# Patient Record
Sex: Female | Born: 1970 | Race: White | Hispanic: No | Marital: Married | Smoking: Never smoker
Health system: Southern US, Community
[De-identification: ages and names within clinical notes are randomized; demographics above are authoritative.]

## PROBLEM LIST (undated history)

## (undated) DIAGNOSIS — F32A Depression, unspecified: Secondary | ICD-10-CM

## (undated) DIAGNOSIS — N920 Excessive and frequent menstruation with regular cycle: Secondary | ICD-10-CM

## (undated) DIAGNOSIS — K219 Gastro-esophageal reflux disease without esophagitis: Secondary | ICD-10-CM

## (undated) DIAGNOSIS — Z973 Presence of spectacles and contact lenses: Secondary | ICD-10-CM

## (undated) DIAGNOSIS — L738 Other specified follicular disorders: Secondary | ICD-10-CM

## (undated) DIAGNOSIS — E039 Hypothyroidism, unspecified: Secondary | ICD-10-CM

## (undated) DIAGNOSIS — M1990 Unspecified osteoarthritis, unspecified site: Secondary | ICD-10-CM

## (undated) DIAGNOSIS — F329 Major depressive disorder, single episode, unspecified: Secondary | ICD-10-CM

## (undated) DIAGNOSIS — F419 Anxiety disorder, unspecified: Secondary | ICD-10-CM

## (undated) HISTORY — PX: TUBAL LIGATION: SHX77

## (undated) HISTORY — DX: Gastro-esophageal reflux disease without esophagitis: K21.9

## (undated) HISTORY — DX: Major depressive disorder, single episode, unspecified: F32.9

## (undated) HISTORY — DX: Other specified follicular disorders: L73.8

## (undated) HISTORY — DX: Hypothyroidism, unspecified: E03.9

## (undated) HISTORY — DX: Depression, unspecified: F32.A

## (undated) HISTORY — DX: Unspecified osteoarthritis, unspecified site: M19.90

## (undated) HISTORY — DX: Excessive and frequent menstruation with regular cycle: N92.0

---

## 2002-05-29 HISTORY — PX: TUBAL LIGATION: SHX77

## 2002-08-05 ENCOUNTER — Other Ambulatory Visit: Admission: RE | Admit: 2002-08-05 | Discharge: 2002-08-05 | Payer: Self-pay | Admitting: Gynecology

## 2003-01-06 ENCOUNTER — Encounter: Admission: RE | Admit: 2003-01-06 | Discharge: 2003-01-06 | Payer: Self-pay | Admitting: Gynecology

## 2003-01-09 ENCOUNTER — Encounter: Admission: RE | Admit: 2003-01-09 | Discharge: 2003-01-09 | Payer: Self-pay | Admitting: Gynecology

## 2003-01-13 ENCOUNTER — Encounter: Admission: RE | Admit: 2003-01-13 | Discharge: 2003-01-13 | Payer: Self-pay | Admitting: Obstetrics and Gynecology

## 2003-01-16 ENCOUNTER — Encounter: Admission: RE | Admit: 2003-01-16 | Discharge: 2003-01-16 | Payer: Self-pay | Admitting: Gynecology

## 2003-01-20 ENCOUNTER — Encounter: Admission: RE | Admit: 2003-01-20 | Discharge: 2003-01-20 | Payer: Self-pay | Admitting: Gynecology

## 2003-01-23 ENCOUNTER — Encounter: Admission: RE | Admit: 2003-01-23 | Discharge: 2003-01-23 | Payer: Self-pay | Admitting: Gynecology

## 2003-01-27 ENCOUNTER — Encounter: Admission: RE | Admit: 2003-01-27 | Discharge: 2003-01-27 | Payer: Self-pay | Admitting: Gynecology

## 2003-01-30 ENCOUNTER — Encounter: Admission: RE | Admit: 2003-01-30 | Discharge: 2003-01-30 | Payer: Self-pay | Admitting: Gynecology

## 2003-02-03 ENCOUNTER — Encounter: Admission: RE | Admit: 2003-02-03 | Discharge: 2003-02-03 | Payer: Self-pay | Admitting: Gynecology

## 2003-02-06 ENCOUNTER — Inpatient Hospital Stay (HOSPITAL_COMMUNITY): Admission: AD | Admit: 2003-02-06 | Discharge: 2003-02-09 | Payer: Self-pay | Admitting: Gynecology

## 2003-02-06 ENCOUNTER — Encounter (INDEPENDENT_AMBULATORY_CARE_PROVIDER_SITE_OTHER): Payer: Self-pay

## 2003-02-06 ENCOUNTER — Encounter: Admission: RE | Admit: 2003-02-06 | Discharge: 2003-02-06 | Payer: Self-pay | Admitting: Gynecology

## 2003-03-24 ENCOUNTER — Other Ambulatory Visit: Admission: RE | Admit: 2003-03-24 | Discharge: 2003-03-24 | Payer: Self-pay | Admitting: Gynecology

## 2004-08-01 ENCOUNTER — Other Ambulatory Visit: Admission: RE | Admit: 2004-08-01 | Discharge: 2004-08-01 | Payer: Self-pay | Admitting: Gynecology

## 2005-10-09 ENCOUNTER — Other Ambulatory Visit: Admission: RE | Admit: 2005-10-09 | Discharge: 2005-10-09 | Payer: Self-pay | Admitting: Gynecology

## 2006-10-25 ENCOUNTER — Other Ambulatory Visit: Admission: RE | Admit: 2006-10-25 | Discharge: 2006-10-25 | Payer: Self-pay | Admitting: Gynecology

## 2008-04-13 ENCOUNTER — Ambulatory Visit: Payer: Self-pay | Admitting: Gynecology

## 2008-08-20 ENCOUNTER — Encounter: Payer: Self-pay | Admitting: Women's Health

## 2008-08-20 ENCOUNTER — Ambulatory Visit: Payer: Self-pay | Admitting: Obstetrics and Gynecology

## 2008-08-20 ENCOUNTER — Other Ambulatory Visit: Admission: RE | Admit: 2008-08-20 | Discharge: 2008-08-20 | Payer: Self-pay | Admitting: Gynecology

## 2009-02-19 ENCOUNTER — Ambulatory Visit: Payer: Self-pay | Admitting: Gynecology

## 2009-12-31 ENCOUNTER — Ambulatory Visit: Payer: Self-pay | Admitting: Gynecology

## 2009-12-31 ENCOUNTER — Other Ambulatory Visit: Admission: RE | Admit: 2009-12-31 | Discharge: 2009-12-31 | Payer: Self-pay | Admitting: Gynecology

## 2010-08-16 ENCOUNTER — Ambulatory Visit (INDEPENDENT_AMBULATORY_CARE_PROVIDER_SITE_OTHER): Payer: PRIVATE HEALTH INSURANCE | Admitting: Gynecology

## 2010-08-16 DIAGNOSIS — B373 Candidiasis of vulva and vagina: Secondary | ICD-10-CM

## 2010-08-16 DIAGNOSIS — N898 Other specified noninflammatory disorders of vagina: Secondary | ICD-10-CM

## 2010-10-14 NOTE — Discharge Summary (Signed)
   NAME:  Dominique Mendoza, Dominique Mendoza                         ACCOUNT NO.:  [id number]   MEDICAL RECORD NO.:  [id number]                   PATIENT TYPE:  INP   LOCATION:  9106                                 FACILITY:  [facility]   PHYSICIAN:  Ivor Costa. Farrel Gobble, M.D.              DATE OF BIRTH:  04-15-1971   DATE OF ADMISSION:  02/06/2003  DATE OF DISCHARGE:  02/09/2003                                 DISCHARGE SUMMARY   DISCHARGE DIAGNOSES:  1. Intrauterine pregnancy 36 weeks and 4 days, delivered spontaneous, twins.  2. Breech presentation.  3. Labor.  4. Undesired fertility.  5. Status post primary cesarean section, low flap transverse and modified     Pomeroy tubal ligation by Ivor Costa. Farrel Gobble, M.D. on February 06, 2003.  6. Rubella nonimmune.   HISTORY:  A 40 year old female gravida 2, para 1 with an EDC of March 03, 2003.  Prenatal course had been complicated by rubella nonimmune, also was  hypothyroid, also spontaneous twins with breech presentation, also desired  attempt at permanent sterilization.  Was found at nonstress test on  February 06, 2003 to be having contractions not painful to patient.  However, vaginal examination revealed patient to be 3 cm, 70%, and -2.  Therefore, patient was admitted for breech/breech presentation of twins with  an early labor.   HOSPITAL COURSE:  On February 06, 2003 patient underwent a primary cesarean  section low flap transverse, modified Pomeroy tubal ligation by Ivor Costa.  Lathrop, M.D. without complications and underwent delivery of a female, Apgars  of 8 and 9, weight of 4 pounds 13 ounces and a second female, Apgars of 9 and  9, weight of 4 pounds 9 ounces.  Postoperatively patient remained afebrile,  stable condition.  She was discharged to home on February 09, 2003 in  satisfactory condition having been given [facility] Gynecology postpartum  instruction/postpartum booklet.   ACCESSORY CLINICAL FINDINGS:  Laboratories:  The patient is O+.   Rubella  nonimmune.  On February 07, 2003 hemoglobin 8.   DISPOSITION:  The patient is discharged to home.  Given a prescription for  Tylox p.r.n. pain.  She was to continue her Synthroid.  She was given  rubella vaccine prior to discharge.  Follow up in the office in six weeks.     Susa Loffler, P.A.                    Ivor Costa. Farrel Gobble, M.D.    TSG/MEDQ  D:  02/27/2003  T:  02/27/2003  Job:  [id number]

## 2010-10-14 NOTE — Op Note (Signed)
NAME:  Dominique Mendoza, Dominique Mendoza                         ACCOUNT NO.:  [id number]   MEDICAL RECORD NO.:  [id number]                   PATIENT TYPE:  INP   LOCATION:  9106                                 FACILITY:  [facility]   PHYSICIAN:  Ivor Costa. Farrel Gobble, M.D.              DATE OF BIRTH:  1970-10-07   DATE OF PROCEDURE:  02/06/2003  DATE OF DISCHARGE:                                 OPERATIVE REPORT   PREOPERATIVE DIAGNOSIS:  1. Spontaneous twins at 36 weeks.  2. Early labor.  3. Undesired fertility.   POSTOPERATIVE DIAGNOSIS:  1. Spontaneous twins at 36 weeks.  2. Early  labor.  3. Undesired fertility.   OPERATION/PROCEDURE:  1. Primary cesarean section. LFT  2. Modified Pomeroy tubal ligation.   SURGEON:  Ivor Costa. Farrel Gobble, M.D.   ANESTHESIA:  Spinal   ESTIMATED BLOOD LOSS:    URINE OUTPUT:    INTRAVENOUS FLUIDS:  4 liters.   FINDINGS:  Infant A was female, frank breech, clear amniotic fluid, Apgars 8  and 9, birth weight 4.13.  Baby B was delivered complex compound with the  hand vertex, clear amniotic fluid, Apgars 8 and 9, birth weight 4.9, also  female.  Normal uterus, tubes, and ovaries.   COMPLICATIONS:  None.   PATHOLOGY:  Placenta.   DESCRIPTION OF PROCEDURE:  The patient was taken to the operating room.  Spinal anesthesia was induced, placed in a supine position, left lateral  displacement,  prepped and draped in the usual sterile fashion.  After  adequate anesthesia was ensured, a Pfannenstiel skin incision was made with  a scalpel and carried through the underlying layer of fascia with  electrocautery.  The fascia was scored at the midline and extended laterally  with electrocautery.  Inferior aspect of the fascia was grasped with  Kochers. Underlying rectus muscles were dissected off with sharp and blunt  dissection. The superior fascia was  dissected off the underlying rectus  muscles.  The rectus muscles were separated in the midline.  The peritoneum  was identified and entered bluntly.  The peritoneal incision was then  extended superiorly and inferiorly with good visualization of the underlying  bowel and bladder.  The orientation of the uterus is confirmed.  The bladder  blade was inserted.  The vesicouterine peritoneum was identified and entered  sharply with Metzenbaums, the incision was extended laterally.  The bladder  flap was created digitally.  The bladder blade was then reinserted, the  lower uterine segment was incised in the transverse fashion with a scalpel.  Clear amniotic fluid was noted.  The infant A was delivered from the frank  breech presentation.  The infant was secured prior to amniotomy, then  delivered with the usual maneuvers.  Cord was cut and clamped and handed off  to the awaiting pediatrician.  Infant B kept presenting with a hand despite  multiple attempts to replace the hand  back in the cavity or to grasp the  fetal foot.  We elected to just do an amniotomy and then deliver infant B  from the vertex presentation compounded with hand.  Cord was cut and clamped  and was again handed off to the awaiting pediatrician.  The cord bloods were  obtained.  The placenta B was clamped with an umbilical clamp, , uterus was  massaged, the placenta was allowed to separate naturally.  The uterus was  then cleared of all clots and debris.  Uterine incision was repaired with a  running locking layer of 0 chromic.  Hemostasis was assured.  The uterus was  then dextrorotated.  The mid portion of the tube was elevated with a  Babcock, two free ties of 2-0 plain were placed, and the mid portion was  excised and the salpinx was also sharply excised, and hemostasis was  assured.  The uterus was then levorotated in a similar fashion the mid  portion of the tube was excised sharply.  Again, hemostasis was assured  before returning it back to the abdomen.  The pelvis was then irrigated with  copious amounts of warm saline.   Reinspection of the incision ensures  hemostasis.  Also, hemostasis was assured under the bladder flap,  peritoneum, and muscles.  The fascia was then closed with 0 Vicryl in a  running fashion.  The subcu was irrigated.  The skin was closed with  staples.  The patient tolerated the procedure well.  Sponge and needle  counts were correct x 2, and she was transferred to the PACU in stable  condition.  She received Ancef intraoperatively at cord clamp.                                               Ivor Costa. Farrel Gobble, M.D.    THL/MEDQ  D:  02/06/2003  T:  02/08/2003  Job:  [id number]

## 2011-02-10 ENCOUNTER — Encounter: Payer: Self-pay | Admitting: Women's Health

## 2011-02-10 ENCOUNTER — Other Ambulatory Visit (HOSPITAL_COMMUNITY)
Admission: RE | Admit: 2011-02-10 | Discharge: 2011-02-10 | Disposition: A | Discharge status: Home / Self Care | Payer: PRIVATE HEALTH INSURANCE | Source: Ambulatory Visit | Attending: Women's Health | Admitting: Women's Health

## 2011-02-10 ENCOUNTER — Ambulatory Visit (INDEPENDENT_AMBULATORY_CARE_PROVIDER_SITE_OTHER): Payer: PRIVATE HEALTH INSURANCE | Admitting: Women's Health

## 2011-02-10 VITALS — BP 124/80 | Ht 63.0 in | Wt 216.0 lb

## 2011-02-10 DIAGNOSIS — Z01419 Encounter for gynecological examination (general) (routine) without abnormal findings: Secondary | ICD-10-CM | POA: Insufficient documentation

## 2011-02-10 DIAGNOSIS — N920 Excessive and frequent menstruation with regular cycle: Secondary | ICD-10-CM

## 2011-02-10 DIAGNOSIS — E039 Hypothyroidism, unspecified: Secondary | ICD-10-CM | POA: Insufficient documentation

## 2011-02-10 MED ORDER — TRANEXAMIC ACID 650 MG PO TABS: 1300.0000 mg | ORAL_TABLET | Freq: Three times a day (TID) | ORAL | Status: DC

## 2011-02-10 NOTE — Progress Notes (Signed)
Dominique Mendoza September 17, 1970 [id number]    History:    The patient presents for annual exam.  Working as a Scientist, physiological in the mental health facility.   Past medical history, past surgical history, family history and social history were all reviewed and documented in the EPIC chart.   ROS:  A  ROS was performed and pertinent positives and negatives are included in the history.  Exam:  Filed Vitals:   02/10/11 0800  BP: 124/80    General appearance:  Normal Head/Neck:  Normal, without cervical or supraclavicular adenopathy. Thyroid:  Symmetrical, normal in size, without palpable masses or nodularity. Respiratory  Effort:  Normal  Auscultation:  Clear without wheezing or rhonchi Cardiovascular  Auscultation:  Regular rate, without rubs, murmurs or gallops  Edema/varicosities:  Not grossly evident Abdominal  Soft,nontender, without masses, guarding or rebound.  Liver/spleen:  No organomegaly noted  Hernia:  None appreciated  Skin  Inspection:  Grossly normal  Palpation:  Grossly normal Neurologic/psychiatric  Orientation:  Normal with appropriate conversation.  Mood/affect:  Normal  Genitourinary    Breasts: Examined lying and sitting.     Right: Without masses, retractions, discharge or axillary adenopathy.     Left: Without masses, retractions, discharge or axillary adenopathy.   Inguinal/mons:  Normal without inguinal adenopathy  External genitalia:  Normal  BUS/Urethra/Skene's glands:  Normal  Bladder:  Normal  Vagina:  Normal  Cervix:  Normal  Uterus:  retroverted, normal in size, shape and contour.  Midline and mobile  Adnexa/parametria:     Rt: Without masses or tenderness.   Lt: Without masses or tenderness.  Anus and perineum: Normal  Digital rectal exam: Normal sphincter tone without palpated masses or tenderness  Assessment/Plan:  40 y.o. MWFG2 P3 for annual exam.  Monthly 6 day cycle menorrhagia/BTL. Her insurance does not cover an ablation, and states  cannot afford. Other options of lysteda was reviewed. Would like to try did review slight risk for blood clots. She is a nonsmoker prescription for lysteda 652 tablets 3 times a day with cycle for no longer than 5 days prescription proper use was given will call if no relief. SBEs, annual mammogram, breast center number was given will schedule. Reviewed importance of weight loss for health, increasing exercise decreasing calories. Pap only today. Hypothyroid and depression labs meds of Synthroid and Effexor/ primary care.   Harrington Challenger [facility], 8:38 AM 02/10/2011

## 2011-02-13 ENCOUNTER — Other Ambulatory Visit: Payer: Self-pay | Admitting: Gynecology

## 2011-02-13 DIAGNOSIS — Z1231 Encounter for screening mammogram for malignant neoplasm of breast: Secondary | ICD-10-CM

## 2011-02-22 ENCOUNTER — Ambulatory Visit (HOSPITAL_COMMUNITY)
Admission: RE | Admit: 2011-02-22 | Discharge: 2011-02-22 | Disposition: A | Discharge status: Home / Self Care | Payer: PRIVATE HEALTH INSURANCE | Source: Ambulatory Visit | Attending: Gynecology | Admitting: Gynecology

## 2011-02-22 DIAGNOSIS — Z1231 Encounter for screening mammogram for malignant neoplasm of breast: Secondary | ICD-10-CM

## 2011-03-13 ENCOUNTER — Other Ambulatory Visit: Payer: Self-pay | Admitting: Gynecology

## 2011-03-14 ENCOUNTER — Other Ambulatory Visit: Payer: Self-pay | Admitting: *Deleted

## 2011-03-14 DIAGNOSIS — N6489 Other specified disorders of breast: Secondary | ICD-10-CM

## 2011-03-23 ENCOUNTER — Other Ambulatory Visit: Payer: Self-pay | Admitting: Gynecology

## 2011-03-23 ENCOUNTER — Ambulatory Visit
Admission: RE | Admit: 2011-03-23 | Discharge: 2011-03-23 | Disposition: A | Discharge status: Home / Self Care | Payer: PRIVATE HEALTH INSURANCE | Source: Ambulatory Visit | Attending: Gynecology | Admitting: Gynecology

## 2011-03-23 DIAGNOSIS — N6489 Other specified disorders of breast: Secondary | ICD-10-CM

## 2011-03-23 DIAGNOSIS — N63 Unspecified lump in unspecified breast: Secondary | ICD-10-CM

## 2011-03-28 ENCOUNTER — Ambulatory Visit
Admission: RE | Admit: 2011-03-28 | Discharge: 2011-03-28 | Disposition: A | Discharge status: Home / Self Care | Payer: PRIVATE HEALTH INSURANCE | Source: Ambulatory Visit | Attending: Gynecology | Admitting: Gynecology

## 2011-03-28 ENCOUNTER — Other Ambulatory Visit: Payer: Self-pay | Admitting: Diagnostic Radiology

## 2011-03-28 DIAGNOSIS — N63 Unspecified lump in unspecified breast: Secondary | ICD-10-CM

## 2011-03-28 HISTORY — PX: BREAST BIOPSY: SHX20

## 2011-08-11 ENCOUNTER — Telehealth: Payer: Self-pay | Admitting: *Deleted

## 2011-08-11 NOTE — Telephone Encounter (Signed)
Pt called c/o yeast infection due to antibody and requested some diflucan from TF, spoke with pt an told her that TF and nancy are out of the office, pt said she will call back on Monday.

## 2011-08-18 ENCOUNTER — Other Ambulatory Visit: Payer: Self-pay

## 2011-08-18 MED ORDER — FLUCONAZOLE 150 MG PO TABS: 150.0000 mg | ORAL_TABLET | Freq: Once | ORAL | Status: AC

## 2011-08-18 NOTE — Telephone Encounter (Signed)
Patient called to say she had been on abx and c/o itching and burning and requested rx for Diflucan.  Send in one pill and told patient if no better to make appt next week.

## 2011-08-21 NOTE — Telephone Encounter (Signed)
Ok - you already sent in diflucan.  No other action at this time - Correct?

## 2011-08-22 ENCOUNTER — Other Ambulatory Visit: Payer: Self-pay | Admitting: *Deleted

## 2011-08-22 DIAGNOSIS — Z09 Encounter for follow-up examination after completed treatment for conditions other than malignant neoplasm: Secondary | ICD-10-CM

## 2011-08-22 NOTE — Telephone Encounter (Signed)
That is correct 

## 2011-09-15 ENCOUNTER — Ambulatory Visit
Admission: RE | Admit: 2011-09-15 | Discharge: 2011-09-15 | Disposition: A | Discharge status: Home / Self Care | Payer: PRIVATE HEALTH INSURANCE | Source: Ambulatory Visit | Attending: Gynecology | Admitting: Gynecology

## 2011-09-15 DIAGNOSIS — Z09 Encounter for follow-up examination after completed treatment for conditions other than malignant neoplasm: Secondary | ICD-10-CM

## 2012-02-14 ENCOUNTER — Encounter: Payer: Self-pay | Admitting: Gynecology

## 2012-02-14 ENCOUNTER — Ambulatory Visit (INDEPENDENT_AMBULATORY_CARE_PROVIDER_SITE_OTHER): Payer: PRIVATE HEALTH INSURANCE | Admitting: Gynecology

## 2012-02-14 VITALS — BP 124/82 | Ht 63.75 in | Wt 227.0 lb

## 2012-02-14 DIAGNOSIS — N760 Acute vaginitis: Secondary | ICD-10-CM

## 2012-02-14 DIAGNOSIS — A499 Bacterial infection, unspecified: Secondary | ICD-10-CM

## 2012-02-14 DIAGNOSIS — N898 Other specified noninflammatory disorders of vagina: Secondary | ICD-10-CM

## 2012-02-14 DIAGNOSIS — B9689 Other specified bacterial agents as the cause of diseases classified elsewhere: Secondary | ICD-10-CM

## 2012-02-14 DIAGNOSIS — Z01419 Encounter for gynecological examination (general) (routine) without abnormal findings: Secondary | ICD-10-CM

## 2012-02-14 LAB — WET PREP FOR TRICH, YEAST, CLUE

## 2012-02-14 MED ORDER — METRONIDAZOLE 500 MG PO TABS: 500.0000 mg | ORAL_TABLET | Freq: Two times a day (BID) | ORAL | Status: DC

## 2012-02-14 NOTE — Progress Notes (Signed)
Dominique Mendoza 26-Jun-1970 [id number]   History:    41 y.o.  for annual gyn exam with complaint of brownish yellow-like discharge for the past few days. Patient's had a previous tubal sterilization procedure. Patient with a monogamous relationship. Review of her record indicated that she has a history of a right breast stereotactic biopsy and had a normal mammogram in May of this year. Patient does her monthly self breast examination. Patient's primary physician is Dr. Lorin Picket who has done her lab work early this year. Patient does have a history of hypothyroidism and is currently on Synthroid. Patient's had normal Pap smears in the past.  Past medical history,surgical history, family history and social history were all reviewed and documented in the EPIC chart.  Gynecologic History Patient's last menstrual period was 01/28/2012. Contraception: tubal ligation Last Pap: 2012. Results were: normal Last mammogram: 2013. Results were: normal  Obstetric History OB History    Grav Para Term Preterm Abortions TAB SAB Ect Mult Living   2 2 1 1     1 3      # Outc Date GA Lbr Len/2nd Wgt Sex Del Anes PTL Lv   1 TRM     M SVD  No Yes   2A PRE     M CS  Yes Yes   2B      M CS  Yes Yes       ROS: A ROS was performed and pertinent positives and negatives are included in the history.  GENERAL: No fevers or chills. HEENT: No change in vision, no earache, sore throat or sinus congestion. NECK: No pain or stiffness. CARDIOVASCULAR: No chest pain or pressure. No palpitations. PULMONARY: No shortness of breath, cough or wheeze. GASTROINTESTINAL: No abdominal pain, nausea, vomiting or diarrhea, melena or bright red blood per rectum. GENITOURINARY: No urinary frequency, urgency, hesitancy or dysuria. MUSCULOSKELETAL: No joint or muscle pain, no back pain, no recent trauma. DERMATOLOGIC: No rash, no itching, no lesions. ENDOCRINE: No polyuria, polydipsia, no heat or cold intolerance. No recent change in weight.  HEMATOLOGICAL: No anemia or easy bruising or bleeding. NEUROLOGIC: No headache, seizures, numbness, tingling or weakness. PSYCHIATRIC: No depression, no loss of interest in normal activity or change in sleep pattern.     Exam: chaperone present  BP 124/82  Ht 5' 3.75" (1.619 m)  Wt 227 lb (102.967 kg)  BMI 39.27 kg/m2  LMP 01/28/2012  Body mass index is 39.27 kg/(m^2).  General appearance : Well developed well nourished female. No acute distress HEENT: Neck supple, trachea midline, no carotid bruits, no thyroidmegaly Lungs: Clear to auscultation, no rhonchi or wheezes, or rib retractions  Heart: Regular rate and rhythm, no murmurs or gallops Breast:Examined in sitting and supine position were symmetrical in appearance, no palpable masses or tenderness,  no skin retraction, no nipple inversion, no nipple discharge, no skin discoloration, no axillary or supraclavicular lymphadenopathy Abdomen: no palpable masses or tenderness, no rebound or guarding Extremities: no edema or skin discoloration or tenderness  Pelvic:  Bartholin, Urethra, Skene Glands: Within normal limits             Vagina: No gross lesions or discharge  Cervix: No gross lesions or discharge  Uterus  anteverted, normal size, shape and consistency, non-tender and mobile  Adnexa  Without masses or tenderness  Anus and perineum  normal   Rectovaginal  normal sphincter tone without palpated masses or tenderness             Hemoccult  not done   Wet prep: Few clue cells, many bacteria, positive Amine  Assessment/Plan:  41 y.o. female for annual exam with clinical evidence of bacterial vaginosis. Patient be placed on Flagyl 500 mg one by mouth twice a day for 5 days. New Pap smear screening guidelines discussed. No Pap smear today. Patient will check with her primary physician to see if her dTap Vaccine is up-to-date. We discussed importance of monthly self breast examination. We discussed importance of regular exercise and  importance of calcium vitamin D for osteoporosis prevention. Patient in the past has suffered from menorrhagia and has been offered Mirena IUD or endometrial ablation the patient is currently not interested.  Ok Edwards MD, 10:21 AM 02/14/2012

## 2012-02-14 NOTE — Patient Instructions (Addendum)
Diphtheria, Tetanus, and Pertussis (DTaP) Vaccine What You Need to Know WHY GET VACCINATED? Diphtheria, tetanus, and pertussis are serious diseases caused by bacteria. Diphtheria and pertussis are spread from person to person. Tetanus enters the body through cuts or wounds. Diphtheria causes a thick covering in the back of the throat.  It can lead to breathing problems, paralysis, heart failure, and even death.  Tetanus (Lockjaw) causes painful tightening of the muscles, usually all over the body.  It can lead to "locking" of the jaw so the victim cannot open his or her mouth or swallow. Tetanus leads to death in about 2 out of 10 cases.  Pertussis (Whooping Cough) causes coughing spells so bad that it is hard for infants to eat, drink, or breathe. These spells can last for weeks.  It can lead to pneumonia, seizures (jerking and staring spells), brain damage, and death.  Diphtheria, tetanus, and pertussis vaccine (DTaP) can help prevent these diseases. Most children who are vaccinated with DTaP will be protected throughout childhood. Many more children would get these diseases if we stopped vaccinating. DTaP is a safer version of an older vaccine called DTP. DTP is no longer used in the [place]. WHO SHOULD GET DTAP VACCINE AND WHEN? Children should get 5 doses of DTaP vaccine, 1 dose at each of the following ages:  2 months.   4 months.   6 months.   15 to 18 months.   4 to 6 years.  DTaP may be given at the same time as other vaccines. SOME CHILDREN SHOULD NOT GET DTAP VACCINE OR SHOULD WAIT  Children with minor illnesses, such as a cold, may be vaccinated. But children who are moderately or severely ill should usually wait until they recover before getting DTaP vaccine.   Any child who had a life-threatening allergic reaction after a dose of DTaP should not get another dose.   Any child who suffered a brain or nervous system disease within 7 days after a dose of DTaP should  not get another dose.   Talk with your caregiver if your child:   Had a seizure or collapsed after a dose of DTaP.   Cried non-stop for 3 hours or more after a dose of DTaP.   Had a fever over 105 F (40.6 C) after a dose of DTaP.   Ask your caregiver for more information. Some of these children should not get another dose of pertussis vaccine, but may get a vaccine without pertussis, called DT.  OLDER CHILDREN AND ADULTS  DTaP is not licensed for adolescents, adults, or children 7 years of age and older.   But older people still need protection. A vaccine called Tdap is similar to DTaP. A single dose of Tdap is recommended for people 11 through 41 years of age. Another vaccine, called Td, protects against tetanus and diphtheria, but not pertussis. It is recommended every 10 years.  WHAT ARE THE RISKS FROM DTAP VACCINE?  Getting diphtheria, tetanus, or pertussis disease is much riskier than getting DTaP vaccine.   However, a vaccine, like any medicine, is capable of causing serious problems, such as severe allergic reactions. The risk of DTaP vaccine causing serious harm, or death, is extremely small.  Mild Problems (Common)  Fever (up to about 1 child in 4).   Redness or swelling where the shot was given (up to about 1 child in 4).   Soreness or tenderness where the shot was given (up to about 1 child in 4).    These problems occur more often after the 4th and 5th doses of the DTaP series than after earlier doses. Sometimes the 4th or 5th dose of DTaP vaccine is followed by swelling of the entire arm or leg in which the shot was given, lasting 1 to 7 days (up to about 1 child in 30). Other mild problems include:  Fussiness (up to about 1 child in 3).   Tiredness or poor appetite (up to about 1 child in 10).   Vomiting (up to about 1 child in 50).  These problems generally occur 1 to 3 days after the shot. Moderate Problems (Uncommon)  Seizure (jerking or staring) (about 1 child  out of 14,000).   Non-stop crying, for 3 hours or more (up to about 1 child out of 1,000).   High fever, over 105 F (40.6 C) (about 1 child out of 16,000).  Severe Problems (Very Rare)  Serious allergic reaction (less than 1 out of a million doses).   Several other severe problems have been reported after DTaP vaccine. These include:   Long-term seizures, coma, or lowered consciousness.   Permanent brain damage.  These are so rare it is hard to tell if they are caused by the vaccine. Controlling fever is especially important for children who have had seizures, for any reason. It is also important if another family member has had seizures. You can reduce fever and pain by giving your child an aspirin-free pain reliever when the shot is given, and for the next 24 hours, following the package instructions. WHAT IF THERE IS A MODERATE OR SEVERE REACTION? What should I look for? Any unusual conditions, such as a serious allergic reaction, high fever, or unusual behavior. Serious allergic reactions are extremely rare with any vaccine. If one were to occur, it would most likely be within a few minutes to a few hours after the shot. Signs can include difficulty breathing, hoarseness or wheezing, hives, paleness, weakness, a fast heartbeat, or dizziness. If a high fever or seizure were to occur, it would usually be within a week after the shot. What should I do?  Call your caregiver or get the person to a caregiver right away.   Tell the caregiver what happened, the date and time it happened, and when the vaccination was given.   Ask the caregiver, nurse, or health department to file a Vaccine Adverse Event Reporting System (VAERS) form. Or, you can file this report through the VAERS website at www.vaers.hhs.gov or by calling 1-800-822-7967.  VAERS does not provide medical advice. THE NATIONAL VACCINE INJURY COMPENSATION PROGRAM  In the rare event that you or your child has a serious reaction  to a vaccine, a federal program has been created to help you pay for the care of those who have been harmed.   For details about the National Vaccine Injury Compensation Program, call 1-800-338-2382 or visit the program's website at www.hrsa.gov/vaccinecompensation  HOW CAN I LEARN MORE?  Ask your caregiver. They can give you the vaccine package insert or suggest other sources of information.   Call your local or state health department's immunization program.   Contact the Centers for Disease Control and Prevention (CDC):   Call 1-800-232-4636 (1-800-CDC-INFO).   Visit the National Immunization Program's website at www.cdc.gov/vaccines  CDC Diphtheria, Tetanus, and Pertussis (DTaP) Vaccine VIS (10/12/05) Document Released: 03/12/2006 Document Revised: 05/04/2011 Document Reviewed: 03/12/2006 ExitCare Patient Information 2012 [employer].  Bacterial Vaginosis Bacterial vaginosis (BV) is a vaginal infection where the normal balance   of bacteria in the vagina is disrupted. The normal balance is then replaced by an overgrowth of certain bacteria. There are several different kinds of bacteria that can cause BV. BV is the most common vaginal infection in women of childbearing age. CAUSES   The cause of BV is not fully understood. BV develops when there is an increase or imbalance of harmful bacteria.   Some activities or behaviors can upset the normal balance of bacteria in the vagina and put women at increased risk including:   Having a new sex partner or multiple sex partners.   Douching.   Using an intrauterine device (IUD) for contraception.   It is not clear what role sexual activity plays in the development of BV. However, women that have never had sexual intercourse are rarely infected with BV.  Women do not get BV from toilet seats, bedding, swimming pools or from touching objects around them.  SYMPTOMS   Grey vaginal discharge.   A fish-like odor with discharge, especially  after sexual intercourse.   Itching or burning of the vagina and vulva.   Burning or pain with urination.   Some women have no signs or symptoms at all.  DIAGNOSIS  Your caregiver must examine the vagina for signs of BV. Your caregiver will perform lab tests and look at the sample of vaginal fluid through a microscope. They will look for bacteria and abnormal cells (clue cells), a pH test higher than 4.5, and a positive amine test all associated with BV.  RISKS AND COMPLICATIONS   Pelvic inflammatory disease (PID).   Infections following gynecology surgery.   Developing HIV.   Developing herpes virus.  TREATMENT  Sometimes BV will clear up without treatment. However, all women with symptoms of BV should be treated to avoid complications, especially if gynecology surgery is planned. Female partners generally do not need to be treated. However, BV may spread between female sex partners so treatment is helpful in preventing a recurrence of BV.   BV may be treated with antibiotics. The antibiotics come in either pill or vaginal cream forms. Either can be used with nonpregnant or pregnant women, but the recommended dosages differ. These antibiotics are not harmful to the baby.   BV can recur after treatment. If this happens, a second round of antibiotics will often be prescribed.   Treatment is important for pregnant women. If not treated, BV can cause a premature delivery, especially for a pregnant woman who had a premature birth in the past. All pregnant women who have symptoms of BV should be checked and treated.   For chronic reoccurrence of BV, treatment with a type of prescribed gel vaginally twice a week is helpful.  HOME CARE INSTRUCTIONS   Finish all medication as directed by your caregiver.   Do not have sex until treatment is completed.   Tell your sexual partner that you have a vaginal infection. They should see their caregiver and be treated if they have problems, such as a  mild rash or itching.   Practice safe sex. Use condoms. Only have 1 sex partner.  PREVENTION  Basic prevention steps can help reduce the risk of upsetting the natural balance of bacteria in the vagina and developing BV:  Do not have sexual intercourse (be abstinent).   Do not douche.   Use all of the medicine prescribed for treatment of BV, even if the signs and symptoms go away.   Tell your sex partner if you have BV. That way,   they can be treated, if needed, to prevent reoccurrence.  SEEK MEDICAL CARE IF:   Your symptoms are not improving after 3 days of treatment.   You have increased discharge, pain, or fever.  MAKE SURE YOU:   Understand these instructions.   Will watch your condition.   Will get help right away if you are not doing well or get worse.  FOR MORE INFORMATION  Division of STD Prevention (DSTDP), Centers for Disease Control and Prevention: www.cdc.gov/std American Social Health Association (ASHA): www.ashastd.org  Document Released: 05/15/2005 Document Revised: 05/04/2011 Document Reviewed: 11/05/2008 ExitCare Patient Information 2012 [employer]. 

## 2012-02-15 ENCOUNTER — Encounter: Payer: Self-pay | Admitting: Gynecology

## 2012-02-20 ENCOUNTER — Encounter: Payer: Self-pay | Admitting: Gynecology

## 2012-02-22 ENCOUNTER — Encounter: Payer: PRIVATE HEALTH INSURANCE | Admitting: Gynecology

## 2012-02-26 ENCOUNTER — Telehealth: Payer: Self-pay | Admitting: Gynecology

## 2012-02-26 ENCOUNTER — Other Ambulatory Visit: Payer: Self-pay | Admitting: Gynecology

## 2012-02-26 MED ORDER — FLUCONAZOLE 100 MG PO TABS: 100.0000 mg | ORAL_TABLET | Freq: Every day | ORAL | Status: DC

## 2012-02-26 NOTE — Telephone Encounter (Signed)
Patient was treated on 9/19 with Metronidazole x 5 days for bacterial vaginal inf. She is now c/o a little discharge, no odor or color.  Some slight itching.  She would like RX.  I recommended OV but patient wanted me to check with you to see if you would just prescribe something.

## 2012-02-26 NOTE — Telephone Encounter (Signed)
Called patient after office hours and inform her that I had called her in a prescription for Diflucan 100 mg one by mouth.

## 2012-03-13 ENCOUNTER — Ambulatory Visit (INDEPENDENT_AMBULATORY_CARE_PROVIDER_SITE_OTHER): Payer: PRIVATE HEALTH INSURANCE | Admitting: Women's Health

## 2012-03-13 DIAGNOSIS — R350 Frequency of micturition: Secondary | ICD-10-CM

## 2012-03-13 DIAGNOSIS — N898 Other specified noninflammatory disorders of vagina: Secondary | ICD-10-CM

## 2012-03-13 LAB — URINALYSIS W MICROSCOPIC + REFLEX CULTURE
Crystals: NONE SEEN
Ketones, ur: NEGATIVE mg/dL
Leukocytes, UA: NEGATIVE
Nitrite: NEGATIVE
Specific Gravity, Urine: 1.02 (ref 1.005–1.030)
Urobilinogen, UA: 0.2 mg/dL (ref 0.0–1.0)
pH: 5.5 (ref 5.0–8.0)

## 2012-03-13 LAB — WET PREP FOR TRICH, YEAST, CLUE: Clue Cells Wet Prep HPF POC: NONE SEEN

## 2012-03-13 MED ORDER — FLUCONAZOLE 150 MG PO TABS: 150.0000 mg | ORAL_TABLET | Freq: Once | ORAL | Status: DC

## 2012-03-13 NOTE — Progress Notes (Signed)
Patient ID: Dominique Mendoza, female   DOB: 18-Apr-1971, 41 y.o.   MRN: [id number] Presents with the complaint of vaginal discharge with itching denies odor. States is also had some increased urinary frequency with slight lower abdominal pressure. Denies a fever.  Exam: External genitalia within normal limits, speculum exam scant white discharge with no odor noted. Wet prep positive for a few yeast. Bimanual no CMT or adnexal fullness or tenderness. UA: Trace blood, 0 - 2 RBCs, rare bacteria.  Yeast  Plan: Urine culture pending, Diflucan 150 by mouth x1 dose prescription, proper use given and reviewed. Instructed  to call if no relief.

## 2012-03-15 LAB — URINE CULTURE: Colony Count: 7000

## 2012-03-19 ENCOUNTER — Telehealth: Payer: Self-pay | Admitting: *Deleted

## 2012-03-19 MED ORDER — TERCONAZOLE 0.4 % VA CREA: 1.0000 | TOPICAL_CREAM | Freq: Every day | VAGINAL | Status: DC

## 2012-03-19 NOTE — Telephone Encounter (Signed)
rx sent, left message on pt voicemail that cream sent.

## 2012-03-19 NOTE — Telephone Encounter (Signed)
Please call patient prescription for Terazol 7 to apply at bedtime for one week intravaginal

## 2012-03-19 NOTE — Telephone Encounter (Signed)
(  Harriett Sine pt) pt has seen you as well. Pt is calling c/o vaginal white discharge and itching. Pt was seen on 03/13/12 giving diflucan 150 mg x1. Pt took pill but still having symptoms,she is requesting another rx. Please advise

## 2012-06-05 ENCOUNTER — Ambulatory Visit (INDEPENDENT_AMBULATORY_CARE_PROVIDER_SITE_OTHER): Payer: PRIVATE HEALTH INSURANCE | Admitting: Gynecology

## 2012-06-05 ENCOUNTER — Encounter: Payer: Self-pay | Admitting: Gynecology

## 2012-06-05 VITALS — BP 140/86

## 2012-06-05 DIAGNOSIS — N6089 Other benign mammary dysplasias of unspecified breast: Secondary | ICD-10-CM | POA: Insufficient documentation

## 2012-06-05 DIAGNOSIS — J329 Chronic sinusitis, unspecified: Secondary | ICD-10-CM

## 2012-06-05 MED ORDER — CEFUROXIME AXETIL 250 MG PO TABS: 250.0000 mg | ORAL_TABLET | Freq: Two times a day (BID) | ORAL | Status: DC

## 2012-06-05 MED ORDER — FLUCONAZOLE 100 MG PO TABS: ORAL_TABLET | ORAL | Status: DC

## 2012-06-05 NOTE — Patient Instructions (Addendum)

## 2012-06-05 NOTE — Progress Notes (Signed)
Patient presented to the office today stating that since the fall of last year she does not like area on her right breast. Patient in 2012 had a right breast biopsy with the following result:  Breast, right, needle core biopsy, 2 o'clock - PSEUDOANGIOMATOUS STROMAL HYPERPLASIA (PASH). - THERE IS NO EVIDENCE OF MALIGNANCY.  Patient had a mammogram in April 2013 with the following result:  Findings: Exam demonstrates scattered fibroglandular densities.  There are stable post biopsy changes of the inner mid right breast.  Remainder of the exam is unchanged.  Mammographic images were processed with CAD.  IMPRESSION:  Stable post biopsy changes of the inner mid right breast.  Recommendations: Recommend continued annual bilateral screening  mammographic follow-up.  Patient denies any recent injury or trauma. She denied any nipple discharge. She's had previous tubal sterilization procedure. Her menstrual cycles are regular. She was also complaining today of postnasal drip tender sinuses no fever chills nausea or vomiting and no coughing.  Exam: HEENT: Patient with tender frontal sinuses Oropharynx: No lesions seen Lungs: Clear to auscultation Rogers or wheezes Breast exam: Both breasts were examined sitting supine position both breasts were symmetrical in appearance no skin discoloration or nipple inversion on palpable masses or tenderness no supraclavicular axillary lymphadenopathy.  The area the patient was concerned about appears to be a very small superficial sebaceous cyst near her parasternal border at approximately the 4:00 position in reference to the breast.   Assessment for/plan: Small superficial right breast sebaceous cysts patient reassured. We'll treat patient's sinusitis with Ceftin 500 mg one by mouth twice a day for 7 days. She was requesting a prescription for Diflucan since she developed yeast infection which she's on oral antibiotics so this was prescribed as well. She was  instructed to continue to do her monthly self breast examination and followup with her mammogram April 2014.

## 2012-07-25 ENCOUNTER — Telehealth: Payer: Self-pay | Admitting: *Deleted

## 2012-07-25 NOTE — Telephone Encounter (Signed)
Pt called c/o irregular spotting, LMP was on 07/09/12. Pt said she would have spotting off & on once her cycle would stop. informed pt OV best next week. Pt said she will call back to schedule after weekend.

## 2012-08-01 ENCOUNTER — Ambulatory Visit (INDEPENDENT_AMBULATORY_CARE_PROVIDER_SITE_OTHER): Payer: PRIVATE HEALTH INSURANCE | Admitting: Gynecology

## 2012-08-01 ENCOUNTER — Telehealth: Payer: Self-pay | Admitting: *Deleted

## 2012-08-01 ENCOUNTER — Encounter: Payer: Self-pay | Admitting: Gynecology

## 2012-08-01 VITALS — BP 130/88

## 2012-08-01 DIAGNOSIS — Z862 Personal history of diseases of the blood and blood-forming organs and certain disorders involving the immune mechanism: Secondary | ICD-10-CM

## 2012-08-01 LAB — CBC WITH DIFFERENTIAL/PLATELET
Basophils Absolute: 0 10*3/uL (ref 0.0–0.1)
HCT: 40 % (ref 36.0–46.0)
Hemoglobin: 13.5 g/dL (ref 12.0–15.0)
Lymphocytes Relative: 23 % (ref 12–46)
Lymphs Abs: 1.6 10*3/uL (ref 0.7–4.0)
Neutro Abs: 4.7 10*3/uL (ref 1.7–7.7)
Neutrophils Relative %: 70 % (ref 43–77)
Platelets: 271 10*3/uL (ref 150–400)

## 2012-08-01 MED ORDER — MEGESTROL ACETATE 40 MG PO TABS: 40.0000 mg | ORAL_TABLET | Freq: Two times a day (BID) | ORAL | Status: DC

## 2012-08-01 NOTE — Progress Notes (Signed)
Patient is a 42 year old who presented to the office today complaining of irregular menstrual cycles. She stated her last menstrual period started in January lasted for 12 days to stop for 16 days and then she has been bleeding ever since. She's also has been complaining of right lower quadrant pulling sensation. Patient with prior tubal sterilization procedure. Patient denies any galactorrhea, visual disturbance, or unusual headaches. Patient has felt at time somewhat shaky. She states that she has had history of anemia in the past whereby she had to receive iron infusion.  Exam: Bartholin urethra Skene was within normal limits Vagina: Blood present in the vaginal vault Cervix: No blood was seen slightly extruding from the cervical os but no lesions seen. Uterus: Slightly anteverted normal size shape and consistency Adnexa: No palpable masses or tenderness  Patient was counseled for endometrial biopsy. The cervix was cleansed with Betadine solution. A single-tooth tenaculum was placed on the anterior cervical lip the uterus sounded to approximately 7 cm and moderate amount of tissue was obtained with the sterile Pipelle and the tissue was submitted for histological evaluation.  Assessment/plan: Patient with dysfunctional uterine bleeding. Endometrial biopsy obtained results pending at time of this dictation. Patient will be placed on Megace 40 mg take 1 by mouth twice a day for 10 days. She will return back to the office next week for sonohysterogram. The following lab work today: CBC, TSH, prolactin and hemoglobin A1c.

## 2012-08-01 NOTE — Patient Instructions (Addendum)

## 2012-08-01 NOTE — Telephone Encounter (Signed)
Pt Rx for Megace 40 mg take 1 by mouth twice a day for 10 days was not sent to pharmacy from today OV. rx will be sent.

## 2012-08-05 ENCOUNTER — Other Ambulatory Visit: Payer: Self-pay | Admitting: Gynecology

## 2012-08-05 ENCOUNTER — Telehealth: Payer: Self-pay

## 2012-08-05 NOTE — Telephone Encounter (Signed)
Patient was here on 08/01/12.  "Patient is a 42 year old who presented to the office today complaining of irregular menstrual cycles. She stated her last menstrual period started in January lasted for 12 days to stop for 16 days and then she has been bleeding ever since. She's also has been complaining of right lower quadrant pulling sensation. "  You prescribed Megace 40 mg bid x 10 days and she has been taking it bid since office visit and spotting has continued like it was in office.  She asked if she could just stop taking it since it does not seem to be working?

## 2012-08-05 NOTE — Telephone Encounter (Signed)
She can either increase her Megace to TID or stop it all together. She neeeds to have her sonohysterogram this week? Tell her the pathology report was benign.

## 2012-08-06 NOTE — Telephone Encounter (Signed)
Left message to call me.

## 2012-08-06 NOTE — Telephone Encounter (Signed)
Patient informed. She will let me know if she needs additional Megace. She said she had enough left to take tid for a few days and see if that helps.

## 2012-08-16 ENCOUNTER — Other Ambulatory Visit: Payer: Self-pay

## 2012-08-16 DIAGNOSIS — Z1231 Encounter for screening mammogram for malignant neoplasm of breast: Secondary | ICD-10-CM

## 2012-08-19 ENCOUNTER — Other Ambulatory Visit: Payer: Self-pay | Admitting: Gynecology

## 2012-08-19 ENCOUNTER — Telehealth: Payer: Self-pay

## 2012-08-19 MED ORDER — MEGESTROL ACETATE 40 MG PO TABS: 40.0000 mg | ORAL_TABLET | Freq: Two times a day (BID) | ORAL | Status: DC

## 2012-08-19 NOTE — Telephone Encounter (Signed)
You can call it in for 5 more days.

## 2012-08-19 NOTE — Telephone Encounter (Signed)
Scheduled for [facility] Weds am this week. Said she has started spotting today. Wondered if you would want to prescribe Megace again?

## 2012-08-21 ENCOUNTER — Ambulatory Visit (INDEPENDENT_AMBULATORY_CARE_PROVIDER_SITE_OTHER): Payer: PRIVATE HEALTH INSURANCE | Admitting: Gynecology

## 2012-08-21 ENCOUNTER — Other Ambulatory Visit: Payer: Self-pay | Admitting: Gynecology

## 2012-08-21 ENCOUNTER — Other Ambulatory Visit: Payer: PRIVATE HEALTH INSURANCE

## 2012-08-21 ENCOUNTER — Ambulatory Visit (INDEPENDENT_AMBULATORY_CARE_PROVIDER_SITE_OTHER): Payer: PRIVATE HEALTH INSURANCE

## 2012-08-21 ENCOUNTER — Telehealth: Payer: Self-pay

## 2012-08-21 DIAGNOSIS — D259 Leiomyoma of uterus, unspecified: Secondary | ICD-10-CM

## 2012-08-21 DIAGNOSIS — N8300 Follicular cyst of ovary, unspecified side: Secondary | ICD-10-CM

## 2012-08-21 DIAGNOSIS — N938 Other specified abnormal uterine and vaginal bleeding: Secondary | ICD-10-CM

## 2012-08-21 DIAGNOSIS — N949 Unspecified condition associated with female genital organs and menstrual cycle: Secondary | ICD-10-CM

## 2012-08-21 DIAGNOSIS — D251 Intramural leiomyoma of uterus: Secondary | ICD-10-CM

## 2012-08-21 DIAGNOSIS — E0590 Thyrotoxicosis, unspecified without thyrotoxic crisis or storm: Secondary | ICD-10-CM

## 2012-08-21 DIAGNOSIS — N920 Excessive and frequent menstruation with regular cycle: Secondary | ICD-10-CM

## 2012-08-21 DIAGNOSIS — N852 Hypertrophy of uterus: Secondary | ICD-10-CM

## 2012-08-21 LAB — THYROID PANEL WITH TSH
Free Thyroxine Index: 4.2 — ABNORMAL HIGH (ref 1.0–3.9)
T4, Total: 11.6 ug/dL (ref 5.0–12.5)
TSH: 4.207 u[IU]/mL (ref 0.350–4.500)

## 2012-08-21 NOTE — Telephone Encounter (Signed)
I left message letting patient know I had spent 30 minutes trying to reach a person through customer service line provided on her card but could not ever reach anyone.  I ended up entering a random extension and leaving a pleading voice mail for a random employee expressing my frustration with the recorded system and asking could she please have someone call me so I could talk with them regarding benefits.  I asked patient if she might have any other number or if her employer might have a different number that would allow me to speak with someone.  I did want her to know I had attempted to check benefits for Mirena and Her Option for her.  I also did let her know I will be out until Monday at noon.

## 2012-08-21 NOTE — Progress Notes (Signed)
Patient presented to the office today for sonohysterogram.patient was seen the office on March 6 complaining of irregular menses and heavy her menstrual cycles are lasting longer than usual. See previous note for further details. On that office visit patient underwent an endometrial biopsy with the following result:  Endometrium, biopsy - BENIGN PROLIFERATIVE-TYPE ENDOMETRIUM. - BENIGN ENDOCERVICAL-TYPE MUCOSA. - THERE IS NO EVIDENCE OF HYPERPLASIA OR MALIGNANCY.  On that same visit patient had a CBC, and prolactin which were normal. Her TSH was found to be elevated at 6.36 and she's having fasting thyroid panel drawn today.  For sonohysterogram today as follows: Uterus measured 10.5 x 5.9 x 6.2 cm with endometrial stripe of 4.8 mm. Patient with an anterior fundal fibroid measuring 36 x 31 x 38 mm slightly displaced and endometrial cavity. Calcifications noted in the cervix measures 7 x 6 mm. Right ovary was normal. Left R. Was normal. There was no fluid in the cul-de-sac. Some infusion histogram demonstrated no intracavitary defect.  Assessment/plan: Patient's last 3 months with menorrhagia. We discussed several options to help contain her menorrhagia to include office endometrial ablation (her option technique) to include also Mirena IUD and to include as well low dose oral contraceptive pill. Patient will finish of her Megace. Literature information was provided. We'll wait for the thyroid function test results come back. Patient would then decide which of the above options she would like to proceed with.

## 2012-08-22 ENCOUNTER — Other Ambulatory Visit: Payer: Self-pay | Admitting: *Deleted

## 2012-08-22 ENCOUNTER — Other Ambulatory Visit: Payer: Self-pay

## 2012-08-22 DIAGNOSIS — E0590 Thyrotoxicosis, unspecified without thyrotoxic crisis or storm: Secondary | ICD-10-CM

## 2012-08-27 HISTORY — PX: ENDOMETRIAL ABLATION: SHX621

## 2012-08-28 ENCOUNTER — Telehealth: Payer: Self-pay

## 2012-08-28 NOTE — Telephone Encounter (Signed)
I called patient to let her know I still have not been able to reach a person at her ins co but was able to receive faxed benefit sheet today and we can piece the benefits together best we can.  I asked her to call me.

## 2012-08-30 ENCOUNTER — Other Ambulatory Visit: Payer: Self-pay | Admitting: Gynecology

## 2012-08-30 ENCOUNTER — Telehealth: Payer: Self-pay

## 2012-08-30 DIAGNOSIS — N920 Excessive and frequent menstruation with regular cycle: Secondary | ICD-10-CM

## 2012-08-30 MED ORDER — PROGESTERONE MICRONIZED 200 MG PO CAPS: ORAL_CAPSULE | ORAL | Status: DC

## 2012-08-30 NOTE — Telephone Encounter (Signed)
Yes I would like her to be in the Prometrium 200 mg one by mouth daily 2 weeks prior for procedure. I did need to see the day before as well.

## 2012-08-30 NOTE — Telephone Encounter (Signed)
Prometrium 200 mg one by mouth daily for 2 weeks before procedure.

## 2012-08-30 NOTE — Telephone Encounter (Signed)
Patient and I discussed ins benefits. She can have Her Option Ablation in the doctor's office for a $30 co-payment.   The first available date is Weds., April 30 and we scheduled patient for 10:00am on this date.  Question is do you want her on Prometrium?  Megace?  She said that she is not currently bleeding but was on Megace prior to [facility] last week. Also, when she should start taking whatever you prescribe?

## 2012-09-02 NOTE — Telephone Encounter (Signed)
Patient informed/instructed. Rx e-scribed.

## 2012-09-05 ENCOUNTER — Other Ambulatory Visit: Payer: Self-pay | Admitting: Gynecology

## 2012-09-06 ENCOUNTER — Telehealth: Payer: Self-pay

## 2012-09-06 MED ORDER — MEGESTROL ACETATE 40 MG PO TABS: 40.0000 mg | ORAL_TABLET | Freq: Two times a day (BID) | ORAL | Status: DC

## 2012-09-06 NOTE — Telephone Encounter (Signed)
Patient is schedule for Her Option Ablation at the end of the month.  She was instructed to start taking Prometrium next Weds for two weeks prior to ablation procedure.  She called today to let you know that she has been bleeding x 7 days now and wonders if you want to put her on Megace or just have her wait until next Weds to start the Prometrium

## 2012-09-06 NOTE — Telephone Encounter (Signed)
Patient informed and rx e-scribed.

## 2012-09-06 NOTE — Telephone Encounter (Signed)
You can call her and Megace 40 mg twice a day for 5 days then start her Prometrium

## 2012-09-13 ENCOUNTER — Ambulatory Visit
Admission: RE | Admit: 2012-09-13 | Discharge: 2012-09-13 | Disposition: A | Discharge status: Home / Self Care | Payer: PRIVATE HEALTH INSURANCE | Source: Ambulatory Visit

## 2012-09-13 DIAGNOSIS — Z1231 Encounter for screening mammogram for malignant neoplasm of breast: Secondary | ICD-10-CM

## 2012-09-23 ENCOUNTER — Telehealth: Payer: Self-pay | Admitting: *Deleted

## 2012-09-23 NOTE — Telephone Encounter (Signed)
Pt informed with the below note. 

## 2012-09-23 NOTE — Telephone Encounter (Signed)
Pt is scheduled for her option on Wednesday, she has been spotting since Saturday. No a heavy flow at all, light only, pt is taking Prometrium as directed. She asked me to relay the information if you would like her start her on megace. Please advise

## 2012-09-23 NOTE — Telephone Encounter (Signed)
No, she can continue on the Prometrium until procedure spotting is okay.

## 2012-09-24 ENCOUNTER — Ambulatory Visit (INDEPENDENT_AMBULATORY_CARE_PROVIDER_SITE_OTHER): Payer: PRIVATE HEALTH INSURANCE | Admitting: Gynecology

## 2012-09-24 ENCOUNTER — Encounter: Payer: Self-pay | Admitting: Gynecology

## 2012-09-24 VITALS — BP 128/80

## 2012-09-24 DIAGNOSIS — N920 Excessive and frequent menstruation with regular cycle: Secondary | ICD-10-CM

## 2012-09-24 DIAGNOSIS — N882 Stricture and stenosis of cervix uteri: Secondary | ICD-10-CM

## 2012-09-24 DIAGNOSIS — Z01818 Encounter for other preprocedural examination: Secondary | ICD-10-CM

## 2012-09-24 NOTE — Progress Notes (Signed)
Patient presented to the office today for preop exam and consultation prior to her scheduled  endometrial ablation via Her Option tomorrow as a result of her menorrhagia. Patient was last seen the office 08/21/2012 whereby she underwent a sonohysterogram which demonstrated the following:  Uterus measured 10.5 x 5.9 x 6.2 cm with endometrial stripe of 4.8 mm. Patient with an anterior fundal fibroid measuring 36 x 31 x 38 mm slightly displaced and endometrial cavity. Calcifications noted in the cervix measures 7 x 6 mm. Right ovary was normal. Left R. Was normal. There was no fluid in the cul-de-sac. Some infusion histogram demonstrated no intracavitary defect.  Prior to that visit on 08/01/2012 she had an endometrial biopsy which demonstrated the following: BENIGN PROLIFERATIVE-TYPE ENDOMETRIUM.  - BENIGN ENDOCERVICAL-TYPE MUCOSA.  - THERE IS NO EVIDENCE OF HYPERPLASIA OR MALIGNANCY.  Patient has had a previous tubal ligation. Patient had a normal CBC and TSH in March.  Patient has been on Prometrium 200 mg daily for 2 weeks prior to her planned procedure. She had previously been provided with literature information on the procedure and all questions are answered today. Patient's last Pap smear was normal in 2012.  Exam: Lungs: Clear to auscultation no rhonchi or wheezes Heart: Regular rate and rhythm no murmurs or gallops Abdomen: Soft nontender no rebound guarding Pelvic: Bartholin urethra Skene glands Vagina: No lesions or discharge some menstrual blood was present Cervix: No lesions some menstrual blood was present Uterus: Anteverted normal size shape and consistency Adnexa: No palpable masses or tenderness Rectal exam deferred  Assessment/plan: Patient with menorrhagia treatment options had been discussed patient decided she would like to proceed with endometrial ablation and ambulatory setting in the office. She scheduled to undergo Her Option endometrial ablation tomorrow. Patient was  prescribed the following: Cytotec 200 mcg which she will take 200 mcg by mouth tonight Valium 5 mg to take by mouth to sleep or when necessary Ultram 50 mg one by mouth every 6 hours when necessary cramps  Tomorrow morning before coming to the office she will take Zithromax 500 mg by mouth and will repeat in 24 hours. She will take Valium 5 mg along with an ultrasound 50 mg before coming to the office for procedure.  A laminaria was placed intracervically today to facilitate cervical dilatation and minimize discomfort tomorrow during the procedure. Consent form was signed all questions are answered and we will follow accordingly.

## 2012-09-25 ENCOUNTER — Ambulatory Visit (INDEPENDENT_AMBULATORY_CARE_PROVIDER_SITE_OTHER): Payer: PRIVATE HEALTH INSURANCE

## 2012-09-25 ENCOUNTER — Ambulatory Visit (INDEPENDENT_AMBULATORY_CARE_PROVIDER_SITE_OTHER): Payer: PRIVATE HEALTH INSURANCE | Admitting: Gynecology

## 2012-09-25 ENCOUNTER — Encounter: Payer: Self-pay | Admitting: Gynecology

## 2012-09-25 VITALS — BP 136/88 | HR 72

## 2012-09-25 VITALS — BP 130/80 | HR 60 | Wt 225.0 lb

## 2012-09-25 DIAGNOSIS — N949 Unspecified condition associated with female genital organs and menstrual cycle: Secondary | ICD-10-CM

## 2012-09-25 DIAGNOSIS — N920 Excessive and frequent menstruation with regular cycle: Secondary | ICD-10-CM

## 2012-09-25 DIAGNOSIS — R102 Pelvic and perineal pain: Secondary | ICD-10-CM

## 2012-09-25 MED ORDER — MEGESTROL ACETATE 40 MG PO TABS: 40.0000 mg | ORAL_TABLET | Freq: Two times a day (BID) | ORAL | Status: DC

## 2012-09-25 MED ORDER — LIDOCAINE HCL 1 % IJ SOLN
10.0000 mL | Freq: Once | INTRAMUSCULAR | Status: AC
  Administered 2012-09-25: 10 mL

## 2012-09-25 MED ORDER — KETOROLAC TROMETHAMINE 30 MG/ML IJ SOLN
60.0000 mg | Freq: Once | INTRAMUSCULAR | Status: AC
  Administered 2012-09-25: 60 mg via INTRAMUSCULAR

## 2012-09-25 NOTE — Progress Notes (Signed)
Patient Name:Dominique Mendoza  Patient MRN: [id number]   Date:09/25/2012   Diagnosis:  Excessive Uterine Bleeding/Menorrhagia  Procedure:  Endometrial cryoablation with intraoperative ultrasonic guidance  Procedure Medications: Toradol 30 mg IM . Paracervical block with 1% lidocaine total 10 cc   Procedure:  The Patient was brought to the treatment room having previously been counseled for the procedure position and a speculum was inserted.  The cervix and upper vagina were cleaned with Betadine.  A single tooth tenaculum was placed on the anterior lip of the cervix.  A paracervical block was placed per above.  The uterus was sounded 8-1/2cm.  Cervical dilation was not performed.  Under ultrasound guidance, the Her Option probe was introduced into the uterine cavity after the pre procedural sequence was performed.  After assuring proper cornual placement, Cryoablation was then performed under continuous ultrasound guidance monitoring the growth of the cryozone.  Sequential cryoablation were performed in the following order, locations, freeze times and post freeze myometrial depths.           Location of Freeze Length of Time Myometrial Depth 1. Right:17.8 mm    6 Minutes  5.6 mm 2. Left: 15.4 mm     11.3 millimeter  Upon completion of the procedure, the instruments were removed, hemostasis visualized and the patient was assisted to the bathroom and then another exam room where she was observed.  Vitals:   Pre treatment:  Time:  10 AM  BP:136/88  P: 72 Post Treatment:  Time: 10:30 AM  BP: 130/86  P: 68  The patient tolerated the procedure well and was released in stable condition with her driver along with a copy of the post procedure instruments which were reviewed with her.  She is to return to the office in 2 weeks for a post procedural check.  [facility] HMD11:58 AMTD@

## 2012-10-09 ENCOUNTER — Ambulatory Visit (INDEPENDENT_AMBULATORY_CARE_PROVIDER_SITE_OTHER): Payer: PRIVATE HEALTH INSURANCE | Admitting: Gynecology

## 2012-10-09 ENCOUNTER — Ambulatory Visit: Payer: PRIVATE HEALTH INSURANCE | Admitting: Gynecology

## 2012-10-09 ENCOUNTER — Encounter: Payer: Self-pay | Admitting: Gynecology

## 2012-10-09 VITALS — BP 130/86

## 2012-10-09 DIAGNOSIS — Z9889 Other specified postprocedural states: Secondary | ICD-10-CM

## 2012-10-09 MED ORDER — METRONIDAZOLE 0.75 % VA GEL: 1.0000 | Freq: Two times a day (BID) | VAGINAL | Status: DC

## 2012-10-09 NOTE — Progress Notes (Signed)
Patient presented to the office today for her two-week postop visit. Patient status post endometrial ablation via her option technique (cryoablation) for her menorrhagia. Patient doing well habits and slightly pinkish discharge.  Exam: Abdomen: Soft nontender no rebound guarding pelvic: And urethra Skene was within normal limits Vagina: Pinkish discharge noted in the vaginal vault Cervix: Friable area on the ectocervix was contained with silver nitrate. Uterus: Anteverted normal size shape and consistency Adnexa: No palpable mass or tenderness Rectal exam: Not done  Assessment/plan: Patient 2 weeks status post endometrial ablation doing well. For prophylaxis she'll be placed on MetroGel vaginal cream to apply each bedtime for 5 nights. She may resume sexual activity in one week.

## 2012-10-16 ENCOUNTER — Telehealth: Payer: Self-pay | Admitting: *Deleted

## 2012-10-16 NOTE — Telephone Encounter (Signed)
Pt is post endometrial ablation via her option technique (cryoablation) for her menorrhagia. Pt said she still bleeding a period flow with a few clots passing, using pads changing every 3 hours. Pt just asked to relay information to you about the above. Please advise

## 2012-10-16 NOTE — Telephone Encounter (Signed)
Pt informed with the below note. 

## 2012-10-16 NOTE — Telephone Encounter (Signed)
Her  ablation was the end of April. It will take a few cycles to begin to see the amount of bleeding during her cyles decease.

## 2013-07-08 ENCOUNTER — Encounter: Payer: Self-pay | Admitting: Gynecology

## 2013-07-08 ENCOUNTER — Other Ambulatory Visit (HOSPITAL_COMMUNITY)
Admission: RE | Admit: 2013-07-08 | Discharge: 2013-07-08 | Disposition: A | Discharge status: Home / Self Care | Payer: PRIVATE HEALTH INSURANCE | Source: Ambulatory Visit | Attending: Gynecology | Admitting: Gynecology

## 2013-07-08 ENCOUNTER — Ambulatory Visit (INDEPENDENT_AMBULATORY_CARE_PROVIDER_SITE_OTHER): Payer: PRIVATE HEALTH INSURANCE | Admitting: Gynecology

## 2013-07-08 VITALS — BP 128/76 | Ht 63.5 in | Wt 204.0 lb

## 2013-07-08 DIAGNOSIS — Z01419 Encounter for gynecological examination (general) (routine) without abnormal findings: Secondary | ICD-10-CM

## 2013-07-08 DIAGNOSIS — Z1151 Encounter for screening for human papillomavirus (HPV): Secondary | ICD-10-CM | POA: Insufficient documentation

## 2013-07-08 NOTE — Addendum Note (Signed)
Addended by: Su Grand A on: 07/08/2013 10:51 AM   Modules accepted: Orders

## 2013-07-08 NOTE — Progress Notes (Signed)
Dominique Mendoza 07/20/1970 [id number]   History:    43 y.o.  for annual gyn exam with no complaints today. The patient had endometrial ablation in 2014 her cycles have gotten lighter. Patient's PCP is Dr. Nicki Reaper who will be doing her blood work in the next few weks. Patient scheduled for mammogram later this year. She does have history of right breast biopsy in 2012 which was benign. Her flu vaccine is up to date as well as her Tdap. Patient with no prior history of abnormal Pap smears.  Past medical history,surgical history, family history and social history were all reviewed and documented in the EPIC chart.  Gynecologic History Patient's last menstrual period was 06/23/2013. Contraception: tubal ligation Last Pap: 2012. Results were: normal Last mammogram: 2014. Results were: normal  Obstetric History OB History  Gravida Para Term Preterm AB SAB TAB Ectopic Multiple Living  2 2 1 1     1 3     # Outcome Date GA Lbr Len/2nd Weight Sex Delivery Anes PTL Lv  2A PRE     M CS  Y Y  2B      M CS  Y Y  1 TRM     M SVD  N Y       ROS: A ROS was performed and pertinent positives and negatives are included in the history.  GENERAL: No fevers or chills. HEENT: No change in vision, no earache, sore throat or sinus congestion. NECK: No pain or stiffness. CARDIOVASCULAR: No chest pain or pressure. No palpitations. PULMONARY: No shortness of breath, cough or wheeze. GASTROINTESTINAL: No abdominal pain, nausea, vomiting or diarrhea, melena or bright red blood per rectum. GENITOURINARY: No urinary frequency, urgency, hesitancy or dysuria. MUSCULOSKELETAL: No joint or muscle pain, no back pain, no recent trauma. DERMATOLOGIC: No rash, no itching, no lesions. ENDOCRINE: No polyuria, polydipsia, no heat or cold intolerance. No recent change in weight. HEMATOLOGICAL: No anemia or easy bruising or bleeding. NEUROLOGIC: No headache, seizures, numbness, tingling or weakness. PSYCHIATRIC: No depression, no  loss of interest in normal activity or change in sleep pattern.     Exam: chaperone present  BP 128/76  Ht 5' 3.5" (1.613 m)  Wt 204 lb (92.534 kg)  BMI 35.57 kg/m2  LMP 06/23/2013  Body mass index is 35.57 kg/(m^2).  General appearance : Well developed well nourished female. No acute distress HEENT: Neck supple, trachea midline, no carotid bruits, no thyroidmegaly Lungs: Clear to auscultation, no rhonchi or wheezes, or rib retractions  Heart: Regular rate and rhythm, no murmurs or gallops Breast:Examined in sitting and supine position were symmetrical in appearance, no palpable masses or tenderness,  no skin retraction, no nipple inversion, no nipple discharge, no skin discoloration, no axillary or supraclavicular lymphadenopathy Abdomen: no palpable masses or tenderness, no rebound or guarding Extremities: no edema or skin discoloration or tenderness  Pelvic:  Bartholin, Urethra, Skene Glands: Within normal limits             Vagina: No gross lesions or discharge  Cervix: No gross lesions or discharge  Uterus  anteverted, irregular-shaped history of fibroid uterus Adnexa  Without masses or tenderness  Anus and perineum  normal   Rectovaginal  normal sphincter tone without palpated masses or tenderness             Hemoccult not indicated     Assessment/Plan:  43 y.o. female for annual exam with past history of uterine fibroids stable on exam. Patient status post  endometrial ablation 2014 doing well. PCP doing blood work. Pap smear was done today in accordance to the new guidelines. Patient was reminded of the importance of calcium and vitamin D in regular exercise for osteoporosis prevention.  Note: This dictation was prepared with  Dragon/digital dictation along withSmart phrase technology. Any transcriptional errors that result from this process are unintentional.   Terrance Mass MD, 10:40 AM 07/08/2013

## 2013-07-08 NOTE — Patient Instructions (Signed)
Human Papillomavirus (HPV) Gardasil Vaccine What You Need to Know WHAT IS HPV?  Genital human papillomavirus (HPV) is the most common sexually transmitted virus in the [place]. More than half of sexually active men and women are infected with HPV at some time in their lives.  About 20 million Americans are currently infected, and about 6 million more get infected each year. HPV is usually spread through sexual contact.  Most HPV infections do not cause any symptoms and go away on their own. But HPV can cause cervical cancer in women. Cervical cancer is the 2nd leading cause of cancer deaths among women around the world. In the [place], about 12,000 women get cervical cancer every year and about 4,000 are expected to die from it.  HPV is also associated with several less common cancers, such as vaginal and vulvar cancers in women, and anal and oropharyngeal (back of the throat, including base of tongue and tonsils) cancers in both men and women. HPV can also cause genital warts and warts in the throat.  There is no cure for HPV infection, but some of the problems it causes can be treated. HPV VACCINE: WHY GET VACCINATED?  The HPV vaccine you are getting is 1 of 2 vaccines that can be given to prevent HPV. It may be given to both males and females.  This vaccine can prevent most cases of cervical cancer in females, if it is given before exposure to the virus. In addition, it can prevent vaginal and vulvar cancer in females, and genital warts and anal cancer in both males and females.  Protection from HPV vaccine is expected to be long-lasting. But vaccination is not a substitute for cervical cancer screening. Women should still get regular Pap tests. WHO SHOULD GET THIS HPV VACCINE AND WHEN? HPV vaccine is given as a 3-dose series.  1st Dose: Now.  2nd Dose: 1 to 2 months after Dose 1.  3rd Dose: 6 months after Dose 1. Additional (booster) doses are not recommended. Routine  Vaccination This HPV vaccine is recommended for girls and boys 7 or 43 years of age. It may be given starting at age 72. Why is HPV vaccine recommended at 59 or 43 years of age?  HPV infection is easily acquired, even with only one sex partner. That is why it is important to get HPV vaccine before any sexual contact takes place. Also, response to the vaccine is better at this age than at older ages. Catch-Up Vaccination This vaccine is recommended for the following people who have not completed the 3-dose series:   Females 60 through 43 years of age.  Males 29 through 43 years of age. This vaccine may be given to men 63 through 43 years of age who have not completed the 3-dose series. It is recommended for men through age 81 who have sex with men or whose immune system is weakened because of HIV infection, other illness, or medications.  HPV vaccine may be given at the same time as other vaccines. SOME PEOPLE SHOULD NOT GET HPV VACCINE OR SHOULD WAIT  Anyone who has ever had a life-threatening allergic reaction to any other component of HPV vaccine, or to a previous dose of HPV vaccine, should not get the vaccine. Tell your doctor if the person getting vaccinated has any severe allergies, including an allergy to yeast.  HPV vaccine is not recommended for pregnant women. However, receiving HPV vaccine when pregnant is not a reason to consider terminating the pregnancy.  Women who are breastfeeding may get the vaccine.  People who are mildly ill when a dose of HPV is planned can still be vaccinated. People with a moderate or severe illness should wait until they are better. WHAT ARE THE RISKS FROM THIS VACCINE?  This HPV vaccine has been used in the [place] and around the world for about 6 years and has been very safe.  However, any medicine could possibly cause a serious problem, such as a severe allergic reaction. The risk of any vaccine causing a serious injury, or death, is extremely  small.  Life-threatening allergic reactions from vaccines are very rare. If they do occur, it would be within a few minutes to a few hours after the vaccination. Several mild to moderate problems are known to occur with HPV vaccine. These do not last long and go away on their own.  Reactions in the arm where the shot was given:  Pain (about 8 people in 10).  Redness or swelling (about 1 person in 4).  Fever:  Mild (100 F or 37.8 C) (about 1 person in 10).  Moderate (102 F or 38.9 C) (about 1 person in 48).  Other problems:  Headache (about 1 person in 3).  Fainting: Brief fainting spells and related symptoms (such as jerking movements) can happen after any medical procedure, including vaccination. Sitting or lying down for about 15 minutes after a vaccination can help prevent fainting and injuries caused by falls. Tell your doctor if the patient feels dizzy or lightheaded, or has vision changes or ringing in the ears.  Like all vaccines, HPV vaccines will continue to be monitored for unusual or severe problems. WHAT IF THERE IS A SERIOUS REACTION? What should I look for?  Any unusual condition, such as a high fever or unusual behavior. Signs of a serious allergic reaction can include difficulty breathing, hoarseness or wheezing, hives, paleness, weakness, a fast heartbeat, or dizziness. What should I do?  Call a doctor, or get the person to a doctor right away.  Tell your doctor what happened, the date and time it happened, and when the vaccination was given.  Ask your doctor, nurse, or health department to report the reaction by filing a Vaccine Adverse Event Reporting System (VAERS) form. Or, you can file this report through the VAERS website at www.vaers.SamedayNews.es or by calling 412-055-5717. VAERS does not provide medical advice. THE NATIONAL VACCINE INJURY COMPENSATION PROGRAM  The National Vaccine Injury Compensation Program (VICP) is a federal program that was created  to compensate people who may have been injured by certain vaccines.  Persons who believe they may have been injured by a vaccine can learn about the program and about filing a claim by calling 747 012 9031 or visiting the [place] website at GoldCloset.com.ee [place]?  Ask your doctor.  Call your local or state health department.  Contact the Centers for Disease Control and Prevention (CDC):  Call (343)688-7217 (1-800-CDC-INFO)  or  Visit CDC's website at http://hunter.com/ CDC Human Papillomavirus (HPV) Gardasil (Interim) 10/13/11 Document Released: 03/12/2006 Document Revised: 03/05/2013 Document Reviewed: 09/03/2012 [facility] Patient Information 2014 [place].

## 2013-08-12 ENCOUNTER — Other Ambulatory Visit: Payer: Self-pay

## 2013-08-12 DIAGNOSIS — Z1231 Encounter for screening mammogram for malignant neoplasm of breast: Secondary | ICD-10-CM

## 2013-09-15 ENCOUNTER — Ambulatory Visit
Admission: RE | Admit: 2013-09-15 | Discharge: 2013-09-15 | Disposition: A | Discharge status: Home / Self Care | Payer: PRIVATE HEALTH INSURANCE | Source: Ambulatory Visit

## 2013-09-15 DIAGNOSIS — Z1231 Encounter for screening mammogram for malignant neoplasm of breast: Secondary | ICD-10-CM

## 2013-09-16 ENCOUNTER — Other Ambulatory Visit: Payer: Self-pay | Admitting: Gynecology

## 2013-09-16 DIAGNOSIS — N63 Unspecified lump in unspecified breast: Secondary | ICD-10-CM

## 2013-09-22 ENCOUNTER — Ambulatory Visit
Admission: RE | Admit: 2013-09-22 | Discharge: 2013-09-22 | Disposition: A | Discharge status: Home / Self Care | Payer: PRIVATE HEALTH INSURANCE | Source: Ambulatory Visit | Attending: Gynecology | Admitting: Gynecology

## 2013-09-22 DIAGNOSIS — N63 Unspecified lump in unspecified breast: Secondary | ICD-10-CM

## 2014-03-30 ENCOUNTER — Encounter: Payer: Self-pay | Admitting: Gynecology

## 2014-07-09 ENCOUNTER — Encounter: Payer: PRIVATE HEALTH INSURANCE | Admitting: Gynecology

## 2014-07-13 ENCOUNTER — Encounter: Payer: PRIVATE HEALTH INSURANCE | Admitting: Gynecology

## 2014-07-20 ENCOUNTER — Encounter: Payer: Self-pay | Admitting: Gynecology

## 2014-07-20 ENCOUNTER — Ambulatory Visit (INDEPENDENT_AMBULATORY_CARE_PROVIDER_SITE_OTHER): Payer: PRIVATE HEALTH INSURANCE | Admitting: Gynecology

## 2014-07-20 VITALS — BP 122/80

## 2014-07-20 DIAGNOSIS — N760 Acute vaginitis: Secondary | ICD-10-CM

## 2014-07-20 DIAGNOSIS — B9689 Other specified bacterial agents as the cause of diseases classified elsewhere: Secondary | ICD-10-CM

## 2014-07-20 DIAGNOSIS — N898 Other specified noninflammatory disorders of vagina: Secondary | ICD-10-CM

## 2014-07-20 DIAGNOSIS — A499 Bacterial infection, unspecified: Secondary | ICD-10-CM

## 2014-07-20 LAB — WET PREP FOR TRICH, YEAST, CLUE
TRICH WET PREP: NONE SEEN
Yeast Wet Prep HPF POC: NONE SEEN

## 2014-07-20 NOTE — Progress Notes (Signed)
   Patient is a 44 year old presented to the office today with a complaint of a vaginal discharge which started last week. She reports that it was pink in appearance no odor and no itching reported. Recently it was more clear. She denied any fever, chills, nausea, or vomiting or any back pain. Patient's had a previous tubal ligation procedure as well as endometrial ablation. Her cycles are every 4-5 weeks and very light. Patient is in a monogamous relationship.  Exam: Well-developed well-nourished female with no acute distress except vaginal discharge. Abdomen: Soft nontender no rebound or guarding Back: No CVA tenderness Pelvic: Bartholin urethra Skene was within normal limits Vagina: No gross lesions or discharge noted Cervix: No lesions or discharge Bimanual exam: Uterus: Anteverted irregular since shape up her limits of normal Adnexa: No palpable masses or tenderness Rectal exam: Not examined  Wet prep: Positive amine, moderate clue cells, too numerous to count bacteria, rare white blood cell  Assessment/plan: Clinical evidence of bacterial vaginosis will be treated with Tindamax 500 mg tablets. She will take 4 tablets today and then repeat in 24 hours. Written information was provided on bacterial vaginosis as well as on Tindamax.

## 2014-07-20 NOTE — Patient Instructions (Signed)
Tinidazole tablets What is this medicine? TINIDAZOLE (tye NI da zole) is an antiinfective. It is used to treat amebiasis, giardiasis, trichomoniasis, and vaginosis. It will not work for colds, flu, or other viral infections. This medicine may be used for other purposes; ask your health care provider or pharmacist if you have questions. COMMON BRAND NAME(S): Tindamax What should I tell my health care provider before I take this medicine? They need to know if you have any of these conditions: -anemia or other blood disorders -if you frequently drink alcohol containing drinks -receiving hemodialysis -seizure disorder -an unusual or allergic reaction to tinidazole, other medicines, foods, dyes, or preservatives -pregnant or trying to get pregnant -breast-feeding How should I use this medicine? Take this medicine by mouth with a full glass of water. Follow the directions on the prescription label. Take with food. Take your medicine at regular intervals. Do not take your medicine more often than directed. Take all of your medicine as directed even if you think you are better. Do not skip doses or stop your medicine early. Talk to your pediatrician regarding the use of this medicine in children. While this drug may be prescribed for children as young as 3 years of age for selected conditions, precautions do apply. Overdosage: If you think you have taken too much of this medicine contact a poison control center or emergency room at once. NOTE: This medicine is only for you. Do not share this medicine with others. What if I miss a dose? If you miss a dose, take it as soon as you can. If it is almost time for your next dose, take only that dose. Do not take double or extra doses. What may interact with this medicine? Do not take this medicine with any of the following medications: -alcohol or any product that contains alcohol -amprenavir oral solution -disulfiram -paclitaxel injection -ritonavir  oral solution -sertraline oral solution -sulfamethoxazole-trimethoprim injection This medicine may also interact with the following medications: -cholestyramine -cimetidine -conivaptan -cyclosporin -fluorouracil -fosphenytoin, phenytoin -ketoconazole -lithium -phenobarbital -tacrolimus -warfarin This list may not describe all possible interactions. Give your health care provider a list of all the medicines, herbs, non-prescription drugs, or dietary supplements you use. Also tell them if you smoke, drink alcohol, or use illegal drugs. Some items may interact with your medicine. What should I watch for while using this medicine? Tell your doctor or health care professional if your symptoms do not improve or if they get worse. Avoid alcoholic drinks while you are taking this medicine and for three days afterward. Alcohol may make you feel dizzy, sick, or flushed. If you are being treated for a sexually transmitted disease, avoid sexual contact until you have finished your treatment. Your sexual partner may also need treatment. What side effects may I notice from receiving this medicine? Side effects that you should report to your doctor or health care professional as soon as possible: -allergic reactions like skin rash, itching or hives, swelling of the face, lips, or tongue -breathing problems -confusion, depression -dark or white patches in the mouth -feeling faint or lightheaded, falls -fever, infection -numbness, tingling, pain or weakness in the hands or feet -pain when passing urine -seizures -unusually weak or tired -vaginal irritation or discharge -vomiting Side effects that usually do not require medical attention (report to your doctor or health care professional if they continue or are bothersome): -dark brown or reddish urine -diarrhea -headache -loss of appetite -metallic taste -nausea -stomach upset This list may not describe all  possible side effects. Call your  doctor for medical advice about side effects. You may report side effects to FDA at 1-800-FDA-1088. Where should I keep my medicine? Keep out of the reach of children. Store at room temperature between 15 and 30 degrees C (59 and 86 degrees F). Protect from light and moisture. Keep container tightly closed. Throw away any unused medicine after the expiration date. NOTE: This sheet is a summary. It may not cover all possible information. If you have questions about this medicine, talk to your doctor, pharmacist, or health care provider.  2015, Elsevier/Gold Standard. (2008-02-10 15:22:28) Bacterial Vaginosis Bacterial vaginosis is a vaginal infection that occurs when the normal balance of bacteria in the vagina is disrupted. It results from an overgrowth of certain bacteria. This is the most common vaginal infection in women of childbearing age. Treatment is important to prevent complications, especially in pregnant women, as it can cause a premature delivery. CAUSES  Bacterial vaginosis is caused by an increase in harmful bacteria that are normally present in smaller amounts in the vagina. Several different kinds of bacteria can cause bacterial vaginosis. However, the reason that the condition develops is not fully understood. RISK FACTORS Certain activities or behaviors can put you at an increased risk of developing bacterial vaginosis, including:  Having a new sex partner or multiple sex partners.  Douching.  Using an intrauterine device (IUD) for contraception. Women do not get bacterial vaginosis from toilet seats, bedding, swimming pools, or contact with objects around them. SIGNS AND SYMPTOMS  Some women with bacterial vaginosis have no signs or symptoms. Common symptoms include:  Grey vaginal discharge.  A fishlike odor with discharge, especially after sexual intercourse.  Itching or burning of the vagina and vulva.  Burning or pain with urination. DIAGNOSIS  Your health care  provider will take a medical history and examine the vagina for signs of bacterial vaginosis. A sample of vaginal fluid may be taken. Your health care provider will look at this sample under a microscope to check for bacteria and abnormal cells. A vaginal pH test may also be done.  TREATMENT  Bacterial vaginosis may be treated with antibiotic medicines. These may be given in the form of a pill or a vaginal cream. A second round of antibiotics may be prescribed if the condition comes back after treatment.  HOME CARE INSTRUCTIONS   Only take over-the-counter or prescription medicines as directed by your health care provider.  If antibiotic medicine was prescribed, take it as directed. Make sure you finish it even if you start to feel better.  Do not have sex until treatment is completed.  Tell all sexual partners that you have a vaginal infection. They should see their health care provider and be treated if they have problems, such as a mild rash or itching.  Practice safe sex by using condoms and only having one sex partner. SEEK MEDICAL CARE IF:   Your symptoms are not improving after 3 days of treatment.  You have increased discharge or pain.  You have a fever. MAKE SURE YOU:   Understand these instructions.  Will watch your condition.  Will get help right away if you are not doing well or get worse. FOR MORE INFORMATION  Centers for Disease Control and Prevention, Division of STD Prevention: Appraiser[employer]raud[employer] American Sexual Health Association (ASHA): www.ashastd.org  Document Released: 05/15/2005 Document Revised: 03/05/2013 Document Reviewed: 12/25/2012 [facility] Patient Information 2015 [place], [place]. This information is not intended to replace advice given to you  by your health care provider. Make sure you discuss any questions you have with your health care provider.

## 2014-08-19 ENCOUNTER — Ambulatory Visit (INDEPENDENT_AMBULATORY_CARE_PROVIDER_SITE_OTHER): Payer: PRIVATE HEALTH INSURANCE | Admitting: Gynecology

## 2014-08-19 ENCOUNTER — Encounter: Payer: Self-pay | Admitting: Gynecology

## 2014-08-19 VITALS — BP 128/80 | Ht 63.25 in | Wt 214.0 lb

## 2014-08-19 DIAGNOSIS — Z01419 Encounter for gynecological examination (general) (routine) without abnormal findings: Secondary | ICD-10-CM

## 2014-08-19 NOTE — Progress Notes (Signed)
Dominique Mendoza May 17, 1971 [id number]   History:    44 y.o.  for annual gyn exam with no complaints today. Review of patient's record indicated that in 2014 she had an endometrial ablation and patient reports very light normal menstrual cycles.Patient's PCP is Dr. Nicki Reaper who will be doing her blood work in the next few weks. Patient scheduled for mammogram later this year. She does have history of right breast biopsy in 2012 which was benign. Her flu vaccine is up to date as well as her Tdap. Patient with no prior history of abnormal Pap smears.  Past medical history,surgical history, family history and social history were all reviewed and documented in the EPIC chart.  Gynecologic History Patient's last menstrual period was 08/03/2014. Contraception: tubal ligation Last Pap: 2015. Results were: normal Last mammogram: 2015. Results were: normal  Obstetric History OB History  Gravida Para Term Preterm AB SAB TAB Ectopic Multiple Living  2 2 1 1     1 3     # Outcome Date GA Lbr Len/2nd Weight Sex Delivery Anes PTL Lv  2A Preterm     M CS-Unspec  Y Y  2B Preterm     M CS-Unspec  Y Y  1 Term     M Vag-Spont  N Y       ROS: A ROS was performed and pertinent positives and negatives are included in the history.  GENERAL: No fevers or chills. HEENT: No change in vision, no earache, sore throat or sinus congestion. NECK: No pain or stiffness. CARDIOVASCULAR: No chest pain or pressure. No palpitations. PULMONARY: No shortness of breath, cough or wheeze. GASTROINTESTINAL: No abdominal pain, nausea, vomiting or diarrhea, melena or bright red blood per rectum. GENITOURINARY: No urinary frequency, urgency, hesitancy or dysuria. MUSCULOSKELETAL: No joint or muscle pain, no back pain, no recent trauma. DERMATOLOGIC: No rash, no itching, no lesions. ENDOCRINE: No polyuria, polydipsia, no heat or cold intolerance. No recent change in weight. HEMATOLOGICAL: No anemia or easy bruising or bleeding.  NEUROLOGIC: No headache, seizures, numbness, tingling or weakness. PSYCHIATRIC: No depression, no loss of interest in normal activity or change in sleep pattern.     Exam: chaperone present  BP 128/80 mmHg  Ht 5' 3.25" (1.607 m)  Wt 214 lb (97.07 kg)  BMI 37.59 kg/m2  LMP 08/03/2014  Body mass index is 37.59 kg/(m^2).  General appearance : Well developed well nourished female. No acute distress HEENT: Eyes: no retinal hemorrhage or exudates,  Neck supple, trachea midline, no carotid bruits, no thyroidmegaly Lungs: Clear to auscultation, no rhonchi or wheezes, or rib retractions  Heart: Regular rate and rhythm, no murmurs or gallops Breast:Examined in sitting and supine position were symmetrical in appearance, no palpable masses or tenderness,  no skin retraction, no nipple inversion, no nipple discharge, no skin discoloration, no axillary or supraclavicular lymphadenopathy Abdomen: no palpable masses or tenderness, no rebound or guarding Extremities: no edema or skin discoloration or tenderness  Pelvic:  Bartholin, Urethra, Skene Glands: Within normal limits             Vagina: No gross lesions or discharge  Cervix: No gross lesions or discharge  Uterus  anteverted, normal size, shape and consistency, non-tender and mobile  Adnexa  Without masses or tenderness  Anus and perineum  normal   Rectovaginal  normal sphincter tone without palpated masses or tenderness             Hemoccult not indicated  Assessment/Plan:  44 y.o. female for annual exam doing well after her endometrial ablation. I have asked her to have her PCP seen has copy of her labs so we can update our records. She was reminded to do her monthly breast exams. We discussed importance of exercise and healthy nutrition to help her lose some weight. Pap smear not done in accordance to the new guidelines. Patient is scheduled 3-D mammogram this year.   Terrance Mass MD, 9:00 AM 08/19/2014

## 2014-09-14 ENCOUNTER — Encounter: Payer: Self-pay | Admitting: Gynecology

## 2014-09-14 ENCOUNTER — Ambulatory Visit (INDEPENDENT_AMBULATORY_CARE_PROVIDER_SITE_OTHER): Payer: PRIVATE HEALTH INSURANCE | Admitting: Gynecology

## 2014-09-14 VITALS — BP 118/74

## 2014-09-14 DIAGNOSIS — N898 Other specified noninflammatory disorders of vagina: Secondary | ICD-10-CM

## 2014-09-14 LAB — WET PREP FOR TRICH, YEAST, CLUE
Clue Cells Wet Prep HPF POC: NONE SEEN
Trich, Wet Prep: NONE SEEN
WBC, Wet Prep HPF POC: NONE SEEN
Yeast Wet Prep HPF POC: NONE SEEN

## 2014-09-14 NOTE — Progress Notes (Signed)
   Patient presented to the office today complaining of a clear white discharge with fishy like odor. She denied any pruritus. She has not tried any over-the-counter products.patient in a monogamous relationship. Patient with prior tubal ligation as well as endometrial ablation in the past.  Exam: Blood pressure 118/74 Gen. Appearance well-developed nourished female with the above-mentioned complaint Pelvic: Bartholin urethra Skene was within normal limits Vagina: Speculum exam clear-like discharge no other lesions noted Cervix: No lesions or discharge Bimanual exam: Not done  Wet prep: Few bacteria  Assessment/plan: Nonspecific vaginitis only few bacteria were going to treat her with Cleocin vaginal cream daily at bedtime for 5-7 days. Symptoms persist she'll return back to the office.

## 2014-10-27 ENCOUNTER — Other Ambulatory Visit: Payer: Self-pay

## 2014-10-27 DIAGNOSIS — Z1231 Encounter for screening mammogram for malignant neoplasm of breast: Secondary | ICD-10-CM

## 2014-11-06 ENCOUNTER — Ambulatory Visit
Admission: RE | Admit: 2014-11-06 | Discharge: 2014-11-06 | Disposition: A | Discharge status: Home / Self Care | Payer: PRIVATE HEALTH INSURANCE | Source: Ambulatory Visit

## 2014-11-06 DIAGNOSIS — Z1231 Encounter for screening mammogram for malignant neoplasm of breast: Secondary | ICD-10-CM

## 2015-08-25 ENCOUNTER — Encounter: Payer: PRIVATE HEALTH INSURANCE | Admitting: Gynecology

## 2015-10-04 ENCOUNTER — Other Ambulatory Visit: Payer: Self-pay

## 2015-10-04 ENCOUNTER — Encounter: Payer: Self-pay | Admitting: Gynecology

## 2015-10-04 ENCOUNTER — Ambulatory Visit (INDEPENDENT_AMBULATORY_CARE_PROVIDER_SITE_OTHER): Payer: PRIVATE HEALTH INSURANCE | Admitting: Gynecology

## 2015-10-04 ENCOUNTER — Other Ambulatory Visit: Payer: Self-pay | Admitting: Gynecology

## 2015-10-04 VITALS — BP 132/80 | Ht 63.5 in | Wt 217.0 lb

## 2015-10-04 DIAGNOSIS — Z01419 Encounter for gynecological examination (general) (routine) without abnormal findings: Secondary | ICD-10-CM | POA: Diagnosis not present

## 2015-10-04 DIAGNOSIS — N951 Menopausal and female climacteric states: Secondary | ICD-10-CM | POA: Diagnosis not present

## 2015-10-04 DIAGNOSIS — N939 Abnormal uterine and vaginal bleeding, unspecified: Secondary | ICD-10-CM

## 2015-10-04 DIAGNOSIS — D251 Intramural leiomyoma of uterus: Secondary | ICD-10-CM | POA: Diagnosis not present

## 2015-10-04 DIAGNOSIS — N920 Excessive and frequent menstruation with regular cycle: Secondary | ICD-10-CM

## 2015-10-04 DIAGNOSIS — Z1231 Encounter for screening mammogram for malignant neoplasm of breast: Secondary | ICD-10-CM

## 2015-10-04 NOTE — Progress Notes (Signed)
Dominique Mendoza 1971-05-10 [id number]   History:    45 y.o.  for annual gyn exam with complaints that over the past several months her cycles have lasted up to 10-12 days. So they appear to be occurring every 3 weeks. She has no cramping associated with it. She does experience at time some hot flashes but very sporadic. Review of her record indicated that back in 2014 because of her menorrhagia she had an endometrial ablation. She has informing her PCP Dr. Nicki Reaper who is in [place], [place] has started her on iron supplementation because of anemia. Review of her record also indicated that she does have history of fibroid uterus in the past. Patient with no past history of any abnormal Pap smears.  Past medical history,surgical history, family history and social history were all reviewed and documented in the EPIC chart.  Gynecologic History Patient's last menstrual period was 09/13/2015. Contraception: tubal ligation Last Pap: 2015. Results were: normal Last mammogram: 2016. Results were: normal  Obstetric History OB History  Gravida Para Term Preterm AB SAB TAB Ectopic Multiple Living  2 2 1 1     1 3     # Outcome Date GA Lbr Len/2nd Weight Sex Delivery Anes PTL Lv  2A Preterm     M CS-Unspec  Y Y  2B Preterm     M CS-Unspec  Y Y  1 Term     M Vag-Spont  N Y       ROS: A ROS was performed and pertinent positives and negatives are included in the history.  GENERAL: No fevers or chills. HEENT: No change in vision, no earache, sore throat or sinus congestion. NECK: No pain or stiffness. CARDIOVASCULAR: No chest pain or pressure. No palpitations. PULMONARY: No shortness of breath, cough or wheeze. GASTROINTESTINAL: No abdominal pain, nausea, vomiting or diarrhea, melena or bright red blood per rectum. GENITOURINARY: No urinary frequency, urgency, hesitancy or dysuria. MUSCULOSKELETAL: No joint or muscle pain, no back pain, no recent trauma. DERMATOLOGIC: No rash, no itching, no lesions.  ENDOCRINE: No polyuria, polydipsia, no heat or cold intolerance. No recent change in weight. HEMATOLOGICAL: No anemia or easy bruising or bleeding. NEUROLOGIC: No headache, seizures, numbness, tingling or weakness. PSYCHIATRIC: No depression, no loss of interest in normal activity or change in sleep pattern.     Exam: chaperone present  BP 132/80 mmHg  Ht 5' 3.5" (1.613 m)  Wt 217 lb (98.431 kg)  BMI 37.83 kg/m2  LMP 09/13/2015  Body mass index is 37.83 kg/(m^2).  General appearance : Well developed well nourished female. No acute distress HEENT: Eyes: no retinal hemorrhage or exudates,  Neck supple, trachea midline, no carotid bruits, no thyroidmegaly Lungs: Clear to auscultation, no rhonchi or wheezes, or rib retractions  Heart: Regular rate and rhythm, no murmurs or gallops Breast:Examined in sitting and supine position were symmetrical in appearance, no palpable masses or tenderness,  no skin retraction, no nipple inversion, no nipple discharge, no skin discoloration, no axillary or supraclavicular lymphadenopathy Abdomen: no palpable masses or tenderness, no rebound or guarding Extremities: no edema or skin discoloration or tenderness  Pelvic:  Bartholin, Urethra, Skene Glands: Within normal limits             Vagina: No gross lesions or discharge  Cervix: No gross lesions or discharge  Uterus  anteverted, normal size, shape and consistency, non-tender and mobile  Adnexa  Without masses or tenderness  Anus and perineum  normal   Rectovaginal  normal sphincter tone without palpated masses or tenderness             Hemoccult not indicated     Assessment/Plan:  45 y.o. female for annual exam who may be perimenopausal so an [place] will be drawn today. Her PCP has done all her other blood work. She will return back to the office for sonohysterogram for further evaluation of her menorrhagia. She is scheduled for mammogram in the next few weeks. Pap smear not indicated this  year.   Terrance Mass MD, 11:53 AM 10/04/2015

## 2015-10-04 NOTE — Patient Instructions (Signed)

## 2015-10-05 LAB — FOLLICLE STIMULATING HORMONE: FSH: 5.3 m[IU]/mL

## 2015-10-12 ENCOUNTER — Telehealth: Payer: Self-pay | Admitting: Gynecology

## 2015-10-12 NOTE — Telephone Encounter (Signed)
10/12/15-Pt was advised today that her [employer] ins puts the cost of the sonohysterogram/bx towards her unmet $500 deductible, then there is a 20% coins until oop of $3500 is met. Her responsibility for the test is $394.16, bx additional $46.10. Per Amber_0 Dirk Dress

## 2015-10-18 ENCOUNTER — Ambulatory Visit (INDEPENDENT_AMBULATORY_CARE_PROVIDER_SITE_OTHER): Payer: PRIVATE HEALTH INSURANCE

## 2015-10-18 ENCOUNTER — Other Ambulatory Visit: Payer: Self-pay | Admitting: Gynecology

## 2015-10-18 ENCOUNTER — Encounter: Payer: Self-pay | Admitting: Gynecology

## 2015-10-18 ENCOUNTER — Ambulatory Visit (INDEPENDENT_AMBULATORY_CARE_PROVIDER_SITE_OTHER): Payer: PRIVATE HEALTH INSURANCE | Admitting: Gynecology

## 2015-10-18 DIAGNOSIS — D251 Intramural leiomyoma of uterus: Secondary | ICD-10-CM | POA: Diagnosis not present

## 2015-10-18 DIAGNOSIS — N920 Excessive and frequent menstruation with regular cycle: Secondary | ICD-10-CM | POA: Diagnosis not present

## 2015-10-18 DIAGNOSIS — N939 Abnormal uterine and vaginal bleeding, unspecified: Secondary | ICD-10-CM

## 2015-10-18 NOTE — Progress Notes (Signed)
    patient is a 45 year old was seen in the office for her annual exam on May 8 and she had stated that for the past several months her cycles have lasted up to 10 or 12 days. Sometimes occurring from 21-35 days.  Patient reports no cramping with it.Review of her record indicated that back in 2014 because of her menorrhagia she had an endometrial ablation. She states that this last cycle now lasted 3-4 days and was normal. She does have a intramural fibroid that has been noted on previous ultrasound. Patient is here for sonohysterogram today and endometrial biopsy.  Ultrasound today: Uterus measured 10.5 x 6.4 x 5.9 cm with endometrial stripe of 5.1 mm. Intramural fibroid measuring 4.2 x 3.5 x 3.7 cm slightly displacing the endometrial cavity. Right and left ovary were otherwise normal no fluid in the cul-de-sac. The cervix is then cleansed with Betadine solution and sterile catheter was introduced into the uterus and no intrauterine uterine defect was noted. Endometrial stripe 2.4 mm.   Following this a cervix was cleansed with Betadine solution once again and a sterile Pipelle was introduced into the uterine cavity and tissue obtained and submitted for histological evaluation (endometrial biopsy) to these.  Assessment/plan: Patient with occasional menorrhagia. Patient was offered treatment options to include Lysteda, Mirena IUD or repeat endometrial ablation or low dose oral contraceptive pill. Patient decided just to wait and monitor. Endometrial biopsy done today result pending at time of this dictation. Of note patient's had a previous tubal ligation.

## 2015-11-08 ENCOUNTER — Ambulatory Visit
Admission: RE | Admit: 2015-11-08 | Discharge: 2015-11-08 | Disposition: A | Discharge status: Home / Self Care | Payer: PRIVATE HEALTH INSURANCE | Source: Ambulatory Visit

## 2015-11-08 ENCOUNTER — Other Ambulatory Visit: Payer: Self-pay | Admitting: Gynecology

## 2015-11-08 DIAGNOSIS — Z1231 Encounter for screening mammogram for malignant neoplasm of breast: Secondary | ICD-10-CM

## 2016-01-04 ENCOUNTER — Telehealth: Payer: Self-pay | Admitting: *Deleted

## 2016-01-04 MED ORDER — LEVONORGESTREL-ETHINYL ESTRAD 0.1-20 MG-MCG PO TABS: 1.0000 | ORAL_TABLET | Freq: Every day | ORAL | 11 refills | Status: DC

## 2016-01-04 NOTE — Telephone Encounter (Signed)
Pt called to follow up from [place] on 10/18/15 decided to proceed with low dose oral birth control pill. Please advise

## 2016-01-04 NOTE — Telephone Encounter (Signed)
Please call in prescription for Aviane 28 day oral contraceptive pill. One package with 11 refills. Have her start the birth control pill on the second day of her next menstrual cycle

## 2016-01-04 NOTE — Telephone Encounter (Signed)
Pt informed, Rx sent. 

## 2016-08-23 ENCOUNTER — Other Ambulatory Visit: Payer: Self-pay | Admitting: Gynecology

## 2016-08-23 DIAGNOSIS — Z1231 Encounter for screening mammogram for malignant neoplasm of breast: Secondary | ICD-10-CM

## 2016-10-04 ENCOUNTER — Ambulatory Visit (INDEPENDENT_AMBULATORY_CARE_PROVIDER_SITE_OTHER): Payer: PRIVATE HEALTH INSURANCE | Admitting: Gynecology

## 2016-10-04 ENCOUNTER — Encounter: Payer: Self-pay | Admitting: Gynecology

## 2016-10-04 VITALS — BP 128/76 | Ht 64.5 in | Wt 227.2 lb

## 2016-10-04 DIAGNOSIS — D251 Intramural leiomyoma of uterus: Secondary | ICD-10-CM | POA: Diagnosis not present

## 2016-10-04 DIAGNOSIS — Z01419 Encounter for gynecological examination (general) (routine) without abnormal findings: Secondary | ICD-10-CM | POA: Diagnosis not present

## 2016-10-04 NOTE — Progress Notes (Signed)
Dominique Mendoza [birth year] [id number]   History:    46 y.o.  for annual gyn exam with no complaints. She describes her menstrual cycles every 21-25 days lasting 5 days. No longer suffering from heavy appears that she was suffering last year when. Review records indicated that she has had an endometrial ablation in the past (cryoablation for her menorrhagia.  Review of her record indicated that she had an ultrasound in 2014 which demonstrated the following: Uterus measured 10.5 x 5.9 x 6.2 cm with endometrial stripe of 4.8 mm. Patient with an anterior fundal fibroid measuring 36 x 31 x 38 mm slightly displaced and endometrial cavity. Calcifications noted in the cervix measures 7 x 6 mm. Right ovary was normal. Left R. Was normal. There was no fluid in the cul-de-sac. Some infusion histogram demonstrated no intracavitary defect.  Prior to that visit on 08/01/2012 she had an endometrial biopsy which demonstrated the following: BENIGN PROLIFERATIVE-TYPE ENDOMETRIUM.  - BENIGN ENDOCERVICAL-TYPE MUCOSA.  - THERE IS NO EVIDENCE OF HYPERPLASIA OR MALIGNANCY.  Patient has had a previous tubal ligation.  Past medical history,surgical history, family history and social history were all reviewed and documented in the EPIC chart.  Gynecologic History Patient's last menstrual period was 09/20/2016 (approximate). Contraception: tubal ligation Last Pap: 2015. Results were: normal Last mammogram: 2017. Results were: normal  Obstetric History OB History  Gravida Para Term Preterm AB Living  2 2 1 1   3   SAB TAB Ectopic Multiple Live Births        1 3    # Outcome Date GA Lbr Len/2nd Weight Sex Delivery Anes PTL Lv  2A Preterm     M CS-Unspec  Y LIV  2B Preterm     M CS-Unspec  Y LIV  1 Term     M Vag-Spont  N LIV       ROS: A ROS was performed and pertinent positives and negatives are included in the history.  GENERAL: No fevers or chills. HEENT: No change in vision, no earache, sore  throat or sinus congestion. NECK: No pain or stiffness. CARDIOVASCULAR: No chest pain or pressure. No palpitations. PULMONARY: No shortness of breath, cough or wheeze. GASTROINTESTINAL: No abdominal pain, nausea, vomiting or diarrhea, melena or bright red blood per rectum. GENITOURINARY: No urinary frequency, urgency, hesitancy or dysuria. MUSCULOSKELETAL: No joint or muscle pain, no back pain, no recent trauma. DERMATOLOGIC: No rash, no itching, no lesions. ENDOCRINE: No polyuria, polydipsia, no heat or cold intolerance. No recent change in weight. HEMATOLOGICAL: No anemia or easy bruising or bleeding. NEUROLOGIC: No headache, seizures, numbness, tingling or weakness. PSYCHIATRIC: No depression, no loss of interest in normal activity or change in sleep pattern.     Exam: chaperone present  BP 128/76   Ht 5' 4.5" (1.638 m)   Wt 227 lb 3.2 oz (103.1 kg)   LMP 09/20/2016 (Approximate)   BMI 38.40 kg/m   Body mass index is 38.4 kg/m.  General appearance : Well developed well nourished female. No acute distress HEENT: Eyes: no retinal hemorrhage or exudates,  Neck supple, trachea midline, no carotid bruits, no thyroidmegaly Lungs: Clear to auscultation, no rhonchi or wheezes, or rib retractions  Heart: Regular rate and rhythm, no murmurs or gallops Breast:Examined in sitting and supine position were symmetrical in appearance, no palpable masses or tenderness,  no skin retraction, no nipple inversion, no nipple discharge, no skin discoloration, no axillary or supraclavicular lymphadenopathy Abdomen: no palpable masses or tenderness, no  rebound or guarding Extremities: no edema or skin discoloration or tenderness  Pelvic:  Bartholin, Urethra, Skene Glands: Within normal limits             Vagina: No gross lesions or discharge  Cervix: No gross lesions or discharge  Uterus  anteverted approximately 6-8 weeks size, , shape and consistency, non-tender and mobile  Adnexa  Without masses or  tenderness  Anus and perineum  normal   Rectovaginal  normal sphincter tone without palpated masses or tenderness             Hemoccult not indicated     Assessment/Plan:  46 y.o. female for annual exam with history of fibroid uterus and we'll schedule an ultrasound the next one to 2 weeks for follow-up may sure fibroid stable. Pap smear was done today according to the new guidelines. PCP is been doing her blood work. Patient due for mammogram the next few weeks.   Terrance Mass MD, 11:08 AM 10/04/2016

## 2016-10-04 NOTE — Patient Instructions (Signed)

## 2016-10-05 ENCOUNTER — Other Ambulatory Visit: Payer: Self-pay | Admitting: *Deleted

## 2016-10-05 DIAGNOSIS — D250 Submucous leiomyoma of uterus: Secondary | ICD-10-CM

## 2016-10-06 LAB — PAP IG W/ RFLX HPV ASCU

## 2016-10-11 ENCOUNTER — Encounter: Payer: Self-pay | Admitting: Gynecology

## 2016-10-16 ENCOUNTER — Ambulatory Visit: Payer: PRIVATE HEALTH INSURANCE | Admitting: Gynecology

## 2016-10-16 ENCOUNTER — Other Ambulatory Visit: Payer: PRIVATE HEALTH INSURANCE

## 2016-11-08 ENCOUNTER — Ambulatory Visit
Admission: RE | Admit: 2016-11-08 | Discharge: 2016-11-08 | Disposition: A | Discharge status: Home / Self Care | Payer: PRIVATE HEALTH INSURANCE | Source: Ambulatory Visit | Attending: Gynecology | Admitting: Gynecology

## 2016-11-08 DIAGNOSIS — Z1231 Encounter for screening mammogram for malignant neoplasm of breast: Secondary | ICD-10-CM

## 2017-10-02 ENCOUNTER — Other Ambulatory Visit: Payer: Self-pay | Admitting: Obstetrics & Gynecology

## 2017-10-02 DIAGNOSIS — Z1231 Encounter for screening mammogram for malignant neoplasm of breast: Secondary | ICD-10-CM

## 2017-10-26 ENCOUNTER — Encounter: Payer: Self-pay | Admitting: Obstetrics & Gynecology

## 2017-10-26 ENCOUNTER — Ambulatory Visit (INDEPENDENT_AMBULATORY_CARE_PROVIDER_SITE_OTHER): Payer: PRIVATE HEALTH INSURANCE | Admitting: Obstetrics & Gynecology

## 2017-10-26 VITALS — BP 126/84 | Ht 63.0 in | Wt 233.0 lb

## 2017-10-26 DIAGNOSIS — Z9851 Tubal ligation status: Secondary | ICD-10-CM | POA: Diagnosis not present

## 2017-10-26 DIAGNOSIS — Z6841 Body Mass Index (BMI) 40.0 and over, adult: Secondary | ICD-10-CM

## 2017-10-26 DIAGNOSIS — N898 Other specified noninflammatory disorders of vagina: Secondary | ICD-10-CM

## 2017-10-26 DIAGNOSIS — Z01419 Encounter for gynecological examination (general) (routine) without abnormal findings: Secondary | ICD-10-CM | POA: Diagnosis not present

## 2017-10-26 LAB — WET PREP FOR TRICH, YEAST, CLUE

## 2017-10-26 NOTE — Progress Notes (Signed)
Dominique Mendoza [birth year] [id number]   History:    47 y.o. G2P2L3 Married.  22 yo son and 89 yo twin sons (all 3 born on 9/10th!)  RP:  Established patient presenting for annual gyn exam   HPI: Menses regular normal every 24-28 days, light flow.  No BTB.  No pelvic pain.  C/O mildly increased vaginal discharge, but no odor or itching.  No pain with IC.  Urine/BMs wnl.  Breasts wnl.  Health labs with Fam MD.  BMI 41.27.    Past medical history,surgical history, family history and social history were all reviewed and documented in the EPIC chart.  Gynecologic History Patient's last menstrual period was 10/20/2017. Contraception: tubal ligation Last Pap: 09/2016. Results were: Negative Last mammogram: 10/2016. Results were: Negative.  Scheduled for 10/2017 Bone Density: Never Colonoscopy: 2018 for rectal bleeding.  Excision of precancerous Polyp.  Repeat Colono this year.  Obstetric History OB History  Gravida Para Term Preterm AB Living  2 2 1 1   3   SAB TAB Ectopic Multiple Live Births        1 3    # Outcome Date GA Lbr Len/2nd Weight Sex Delivery Anes PTL Lv  2A Preterm     M CS-Unspec  Y LIV  2B Preterm     M CS-Unspec  Y LIV  1 Term     M Vag-Spont  N LIV     ROS: A ROS was performed and pertinent positives and negatives are included in the history.  GENERAL: No fevers or chills. HEENT: No change in vision, no earache, sore throat or sinus congestion. NECK: No pain or stiffness. CARDIOVASCULAR: No chest pain or pressure. No palpitations. PULMONARY: No shortness of breath, cough or wheeze. GASTROINTESTINAL: No abdominal pain, nausea, vomiting or diarrhea, melena or bright red blood per rectum. GENITOURINARY: No urinary frequency, urgency, hesitancy or dysuria. MUSCULOSKELETAL: No joint or muscle pain, no back pain, no recent trauma. DERMATOLOGIC: No rash, no itching, no lesions. ENDOCRINE: No polyuria, polydipsia, no heat or cold intolerance. No recent change in weight.  HEMATOLOGICAL: No anemia or easy bruising or bleeding. NEUROLOGIC: No headache, seizures, numbness, tingling or weakness. PSYCHIATRIC: No depression, no loss of interest in normal activity or change in sleep pattern.     Exam:   BP 126/84   Ht 5\' 3"  (1.6 m)   Wt 233 lb (105.7 kg)   LMP 10/20/2017 Comment: tubal ligation   BMI 41.27 kg/m   Body mass index is 41.27 kg/m.  General appearance : Well developed well nourished female. No acute distress HEENT: Eyes: no retinal hemorrhage or exudates,  Neck supple, trachea midline, no carotid bruits, no thyroidmegaly Lungs: Clear to auscultation, no rhonchi or wheezes, or rib retractions  Heart: Regular rate and rhythm, no murmurs or gallops Breast:Examined in sitting and supine position were symmetrical in appearance, no palpable masses or tenderness,  no skin retraction, no nipple inversion, no nipple discharge, no skin discoloration, no axillary or supraclavicular lymphadenopathy Abdomen: no palpable masses or tenderness, no rebound or guarding Extremities: no edema or skin discoloration or tenderness  Pelvic: Vulva: Normal             Vagina: No gross lesions.  Mildly increased vaginal discharge.  Wet prep done.  Cervix: No gross lesions or discharge  Uterus  AV, normal size, shape and consistency, non-tender and mobile  Adnexa  Without masses or tenderness  Anus: Normal  Wet prep negative   Assessment/Plan:  47 y.o. female for annual exam   1. Well female exam with routine gynecological exam Normal gynecologic exam.  Last Pap test negative May 2018.  Will repeat next year.  Breast exam normal.  Patient is scheduled for screening mammogram in June 2019.  Health labs with family physician.  2. S/P tubal ligation  3. Vaginal discharge Wet prep negative.  Patient reassured.  4. Class 3 severe obesity due to excess calories without serious comorbidity with body mass index (BMI) of 40.0 to 44.9 in adult [facility]) Recommend low  calorie/carb nutrition and regular physical activity with aerobic 5 times a week and weightlifting every 2 days.  [name] MD, 2:41 PM 10/26/2017

## 2017-10-28 ENCOUNTER — Encounter: Payer: Self-pay | Admitting: Obstetrics & Gynecology

## 2017-10-28 NOTE — Patient Instructions (Signed)
1. Well female exam with routine gynecological exam Normal gynecologic exam.  Last Pap test negative May 2018.  Will repeat next year.  Breast exam normal.  Patient is scheduled for screening mammogram in June 2019.  Health labs with family physician.  2. S/P tubal ligation  3. Vaginal discharge Wet prep negative.  Patient reassured.  4. Class 3 severe obesity due to excess calories without serious comorbidity with body mass index (BMI) of 40.0 to 44.9 in adult [facility]) Recommend low calorie/carb nutrition and regular physical activity with aerobic 5 times a week and weightlifting every 2 days.  Dominique Mendoza, good seeing you today!

## 2017-11-13 ENCOUNTER — Ambulatory Visit
Admission: RE | Admit: 2017-11-13 | Discharge: 2017-11-13 | Disposition: A | Discharge status: Home / Self Care | Payer: PRIVATE HEALTH INSURANCE | Source: Ambulatory Visit | Attending: Obstetrics & Gynecology | Admitting: Obstetrics & Gynecology

## 2017-11-13 DIAGNOSIS — Z1231 Encounter for screening mammogram for malignant neoplasm of breast: Secondary | ICD-10-CM

## 2018-10-04 ENCOUNTER — Other Ambulatory Visit: Payer: Self-pay | Admitting: Obstetrics & Gynecology

## 2018-10-04 DIAGNOSIS — Z1231 Encounter for screening mammogram for malignant neoplasm of breast: Secondary | ICD-10-CM

## 2018-10-28 ENCOUNTER — Other Ambulatory Visit: Payer: Self-pay

## 2018-10-29 ENCOUNTER — Encounter: Payer: Self-pay | Admitting: Obstetrics & Gynecology

## 2018-10-29 ENCOUNTER — Ambulatory Visit (INDEPENDENT_AMBULATORY_CARE_PROVIDER_SITE_OTHER): Payer: PRIVATE HEALTH INSURANCE | Admitting: Obstetrics & Gynecology

## 2018-10-29 VITALS — BP 116/78 | Ht 63.0 in | Wt 233.0 lb

## 2018-10-29 DIAGNOSIS — N951 Menopausal and female climacteric states: Secondary | ICD-10-CM

## 2018-10-29 DIAGNOSIS — Z01419 Encounter for gynecological examination (general) (routine) without abnormal findings: Secondary | ICD-10-CM

## 2018-10-29 DIAGNOSIS — E66813 Obesity, class 3: Secondary | ICD-10-CM

## 2018-10-29 DIAGNOSIS — Z9851 Tubal ligation status: Secondary | ICD-10-CM | POA: Diagnosis not present

## 2018-10-29 DIAGNOSIS — Z6841 Body Mass Index (BMI) 40.0 and over, adult: Secondary | ICD-10-CM

## 2018-10-29 NOTE — Progress Notes (Signed)
Dominique Mendoza [birth year] [id number]   History:    48 y.o. G2P2L3 Married.  S/P TL. Son 29 yo, twin sons 40 yo.  2 grand-daughters, daughter in Sports coach pregnant.  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  RP:  Established patient presenting for annual gyn exam   HPI: Menstrual periods every 1-3 months, normal flow.  S/P Endometrial Ablation and TL.  No pelvic pain.  No pain with IC.  Normal vaginal secretions.  Urine/BMs normal.  Breasts normal.  BMI 41.27.  Enjoys walking and playing with grand-daughters.  Fasting Health Labs with Fam MD.  Past medical history,surgical history, family history and social history were all reviewed and documented in the EPIC chart.  Gynecologic History Patient's last menstrual period was 10/09/2018. Contraception: tubal ligation Last Pap: 09/2016. Results were: Negative Last mammogram: 10/2017. Results were: Negative Bone Density: Never Colonoscopy: 11/2017 Normal, on a 5 yr schedule  Obstetric History OB History  Gravida Para Term Preterm AB Living  2 2 1 1   3   SAB TAB Ectopic Multiple Live Births        1 3    # Outcome Date GA Lbr Len/2nd Weight Sex Delivery Anes PTL Lv  2A Preterm     M CS-Unspec  Y LIV  2B Preterm     M CS-Unspec  Y LIV  1 Term     M Vag-Spont  N LIV     ROS: A ROS was performed and pertinent positives and negatives are included in the history.  GENERAL: No fevers or chills. HEENT: No change in vision, no earache, sore throat or sinus congestion. NECK: No pain or stiffness. CARDIOVASCULAR: No chest pain or pressure. No palpitations. PULMONARY: No shortness of breath, cough or wheeze. GASTROINTESTINAL: No abdominal pain, nausea, vomiting or diarrhea, melena or bright red blood per rectum. GENITOURINARY: No urinary frequency, urgency, hesitancy or dysuria. MUSCULOSKELETAL: No joint or muscle pain, no back pain, no recent trauma. DERMATOLOGIC: No rash, no itching, no lesions. ENDOCRINE: No  polyuria, polydipsia, no heat or cold intolerance. No recent change in weight. HEMATOLOGICAL: No anemia or easy bruising or bleeding. NEUROLOGIC: No headache, seizures, numbness, tingling or weakness. PSYCHIATRIC: No depression, no loss of interest in normal activity or change in sleep pattern.     Exam:   BP 116/78   Ht 5\' 3"  (1.6 m)   Wt 233 lb (105.7 kg)   LMP 10/09/2018   BMI 41.27 kg/m   Body mass index is 41.27 kg/m.  General appearance : Well developed well nourished female. No acute distress HEENT: Eyes: no retinal hemorrhage or exudates,  Neck supple, trachea midline, no carotid bruits, no thyroidmegaly Lungs: Clear to auscultation, no rhonchi or wheezes, or rib retractions  Heart: Regular rate and rhythm, no murmurs or gallops Breast:Examined in sitting and supine position were symmetrical in appearance, no palpable masses or tenderness,  no skin retraction, no nipple inversion, no nipple discharge, no skin discoloration, no axillary or supraclavicular lymphadenopathy Abdomen: no palpable masses or tenderness, no rebound or guarding Extremities: no edema or skin discoloration or tenderness  Pelvic: Vulva: Normal             Vagina: No gross lesions or discharge  Cervix: No gross lesions or discharge.  Pap reflex done  Uterus  AV, normal size, shape and consistency, non-tender and mobile  Adnexa  Without masses or tenderness  Anus: Normal   Assessment/Plan:  48 y.o. female for annual  exam   1. Encounter for routine gynecological examination with Papanicolaou smear of cervix Normal gynecologic exam.  Pap reflex done.  Breast exam normal.  Last screening mammogram in June 2019 was negative, schedule for a mammogram now.  Colonoscopy negative in July 2019, on a 5-year schedule.  Health labs with family physician.  2. S/P tubal ligation  3. Perimenopause Oligomenorrhea with menstrual.'s every 1 to 3 months.  Normal flow status post endometrial ablation.  Will call back  for evaluation if develops abnormal irregular or heavy bleeding.  If spacing more than 3 months, will call back to receive a progestin to bring a period.  4. Class 3 severe obesity due to excess calories without serious comorbidity with body mass index (BMI) of 40.0 to 44.9 in adult [facility]) Recommend a low calorie/carb diet such as [employer].  Aerobic physical activities 5 times a week and weightlifting every 2 days.  [name] MD, 8:37 AM 10/29/2018

## 2018-10-29 NOTE — Addendum Note (Signed)
Addended by: Thurnell Garbe A on: 10/29/2018 09:39 AM   Modules accepted: Orders

## 2018-10-29 NOTE — Patient Instructions (Signed)
1. Encounter for routine gynecological examination with Papanicolaou smear of cervix Normal gynecologic exam.  Pap reflex done.  Breast exam normal.  Last screening mammogram in June 2019 was negative, schedule for a mammogram now.  Colonoscopy negative in July 2019, on a 5-year schedule.  Health labs with family physician.  2. S/P tubal ligation  3. Perimenopause Oligomenorrhea with menstrual.'s every 1 to 3 months.  Normal flow status post endometrial ablation.  Will call back for evaluation if develops abnormal irregular or heavy bleeding.  If spacing more than 3 months, will call back to receive a progestin to bring a period.  4. Class 3 severe obesity due to excess calories without serious comorbidity with body mass index (BMI) of 40.0 to 44.9 in adult [facility]) Recommend a low calorie/carb diet such as [employer].  Aerobic physical activities 5 times a week and weightlifting every 2 days.  Rifky, it was a pleasure seeing you today!  I will inform you of your results as soon as they are available.

## 2018-10-30 ENCOUNTER — Encounter: Payer: PRIVATE HEALTH INSURANCE | Admitting: Obstetrics & Gynecology

## 2018-10-30 LAB — PAP IG W/ RFLX HPV ASCU

## 2018-12-02 ENCOUNTER — Ambulatory Visit
Admission: RE | Admit: 2018-12-02 | Discharge: 2018-12-02 | Disposition: A | Discharge status: Home / Self Care | Payer: PRIVATE HEALTH INSURANCE | Source: Ambulatory Visit | Attending: Obstetrics & Gynecology | Admitting: Obstetrics & Gynecology

## 2018-12-02 ENCOUNTER — Other Ambulatory Visit: Payer: Self-pay

## 2018-12-02 DIAGNOSIS — Z1231 Encounter for screening mammogram for malignant neoplasm of breast: Secondary | ICD-10-CM

## 2019-05-30 DIAGNOSIS — U071 COVID-19: Secondary | ICD-10-CM

## 2019-05-30 HISTORY — DX: COVID-19: U07.1

## 2019-11-03 ENCOUNTER — Encounter: Payer: PRIVATE HEALTH INSURANCE | Admitting: Obstetrics & Gynecology

## 2019-11-03 ENCOUNTER — Other Ambulatory Visit: Payer: Self-pay

## 2019-11-04 ENCOUNTER — Other Ambulatory Visit: Payer: Self-pay | Admitting: Obstetrics & Gynecology

## 2019-11-04 ENCOUNTER — Encounter: Payer: Self-pay | Admitting: Obstetrics & Gynecology

## 2019-11-04 ENCOUNTER — Ambulatory Visit (INDEPENDENT_AMBULATORY_CARE_PROVIDER_SITE_OTHER): Payer: PRIVATE HEALTH INSURANCE | Admitting: Obstetrics & Gynecology

## 2019-11-04 VITALS — BP 116/78 | Ht 63.0 in | Wt 160.0 lb

## 2019-11-04 DIAGNOSIS — Z01419 Encounter for gynecological examination (general) (routine) without abnormal findings: Secondary | ICD-10-CM

## 2019-11-04 DIAGNOSIS — Z9851 Tubal ligation status: Secondary | ICD-10-CM

## 2019-11-04 DIAGNOSIS — Z1231 Encounter for screening mammogram for malignant neoplasm of breast: Secondary | ICD-10-CM

## 2019-11-04 DIAGNOSIS — E663 Overweight: Secondary | ICD-10-CM

## 2019-11-04 NOTE — Progress Notes (Addendum)
Dominique Mendoza [birth year] [id number]   History:    49 y.o. G2P2L3 Married.  S/P TL. Son 64 yo, twin sons 35 yo.  3 grand-daughters.  1 on the way.  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  RP:  Established patient presenting for annual gyn exam   HPI: Menstrual periods every 26 days, normal flow.  S/P Endometrial Ablation and TL.  No pelvic pain.  No pain with IC.  Normal vaginal secretions.  Urine/BMs normal.  Breasts normal.  BMI 41.27 last year, now decreased to 28.34!  On Weight Watchers.  Walking most days.  Enjoys playing with grand-daughters.  Fasting Health Labs with Fam MD.  Colonoscopy 2019, every 5 yrs.   Past medical history,surgical history, family history and social history were all reviewed and documented in the EPIC chart.  Gynecologic History Patient's last menstrual period was 10/06/2019.  Obstetric History OB History  Gravida Para Term Preterm AB Living  2 2 1 1   3   SAB TAB Ectopic Multiple Live Births        1 3    # Outcome Date GA Lbr Len/2nd Weight Sex Delivery Anes PTL Lv  2A Preterm     M CS-Unspec  Y LIV  2B Preterm     M CS-Unspec  Y LIV  1 Term     M Vag-Spont  N LIV     ROS: A ROS was performed and pertinent positives and negatives are included in the history.  GENERAL: No fevers or chills. HEENT: No change in vision, no earache, sore throat or sinus congestion. NECK: No pain or stiffness. CARDIOVASCULAR: No chest pain or pressure. No palpitations. PULMONARY: No shortness of breath, cough or wheeze. GASTROINTESTINAL: No abdominal pain, nausea, vomiting or diarrhea, melena or bright red blood per rectum. GENITOURINARY: No urinary frequency, urgency, hesitancy or dysuria. MUSCULOSKELETAL: No joint or muscle pain, no back pain, no recent trauma. DERMATOLOGIC: No rash, no itching, no lesions. ENDOCRINE: No polyuria, polydipsia, no heat or cold intolerance. No recent change in weight. HEMATOLOGICAL: No anemia or  easy bruising or bleeding. NEUROLOGIC: No headache, seizures, numbness, tingling or weakness. PSYCHIATRIC: No depression, no loss of interest in normal activity or change in sleep pattern.     Exam:   BP 116/78   Ht 5\' 3"  (1.6 m)   Wt 160 lb (72.6 kg)   LMP 10/06/2019   BMI 28.34 kg/m   Body mass index is 28.34 kg/m.  General appearance : Well developed well nourished female. No acute distress HEENT: Eyes: no retinal hemorrhage or exudates,  Neck supple, trachea midline, no carotid bruits, no thyroidmegaly Lungs: Clear to auscultation, no rhonchi or wheezes, or rib retractions  Heart: Regular rate and rhythm, no murmurs or gallops Breast:Examined in sitting and supine position were symmetrical in appearance, no palpable masses or tenderness,  no skin retraction, no nipple inversion, no nipple discharge, no skin discoloration, no axillary or supraclavicular lymphadenopathy Abdomen: no palpable masses or tenderness, no rebound or guarding Extremities: no edema or skin discoloration or tenderness  Pelvic: Vulva: Normal             Vagina: No gross lesions or discharge  Cervix: No gross lesions or discharge  Uterus  AV, normal size, shape and consistency, non-tender and mobile  Adnexa  Without masses or tenderness  Anus: Normal   Assessment/Plan:  49 y.o. female for annual exam   1. Well female exam with routine gynecological  exam Normal gynecologic exam.  No indication to repeat a Pap test this year.  Breast exam normal.  Screening mammogram July 2020 was negative.  Health labs with family physician.  Colonoscopy 2019.  2. S/P tubal ligation  3. Overweight (BMI 25.0-29.9) Successful weight loss with weight watcher.  We will continue with that regimen.  Continue to walk on a regular basis.  [name] MD, 12:15 PM 11/04/2019

## 2019-11-10 ENCOUNTER — Encounter: Payer: Self-pay | Admitting: Obstetrics & Gynecology

## 2019-11-10 NOTE — Patient Instructions (Addendum)
1. Well female exam with routine gynecological exam Normal gynecologic exam.  No indication to repeat a Pap test this year.  Breast exam normal.  Screening mammogram July 2020 was negative.  Health labs with family physician.  Colonoscopy 2019.  2. S/P tubal ligation  3. Overweight (BMI 25.0-29.9) Successful weight loss with weight watcher.  We will continue with that regimen.  Continue to walk on a regular basis.  Magda, it was a pleasure seeing you today!

## 2019-12-04 ENCOUNTER — Ambulatory Visit: Payer: PRIVATE HEALTH INSURANCE

## 2019-12-10 ENCOUNTER — Ambulatory Visit
Admission: RE | Admit: 2019-12-10 | Discharge: 2019-12-10 | Disposition: A | Discharge status: Home / Self Care | Payer: PRIVATE HEALTH INSURANCE | Source: Ambulatory Visit | Attending: Obstetrics & Gynecology | Admitting: Obstetrics & Gynecology

## 2019-12-10 ENCOUNTER — Other Ambulatory Visit: Payer: Self-pay

## 2019-12-10 DIAGNOSIS — Z1231 Encounter for screening mammogram for malignant neoplasm of breast: Secondary | ICD-10-CM

## 2020-08-29 IMAGING — MG DIGITAL SCREENING BILAT W/ CAD
4 series · 4 of 4 positions shown · non-contrast
Comparison: Previous exam(s).

CLINICAL DATA: Screening.

EXAM:
DIGITAL SCREENING BILATERAL MAMMOGRAM WITH CAD

[L CC]
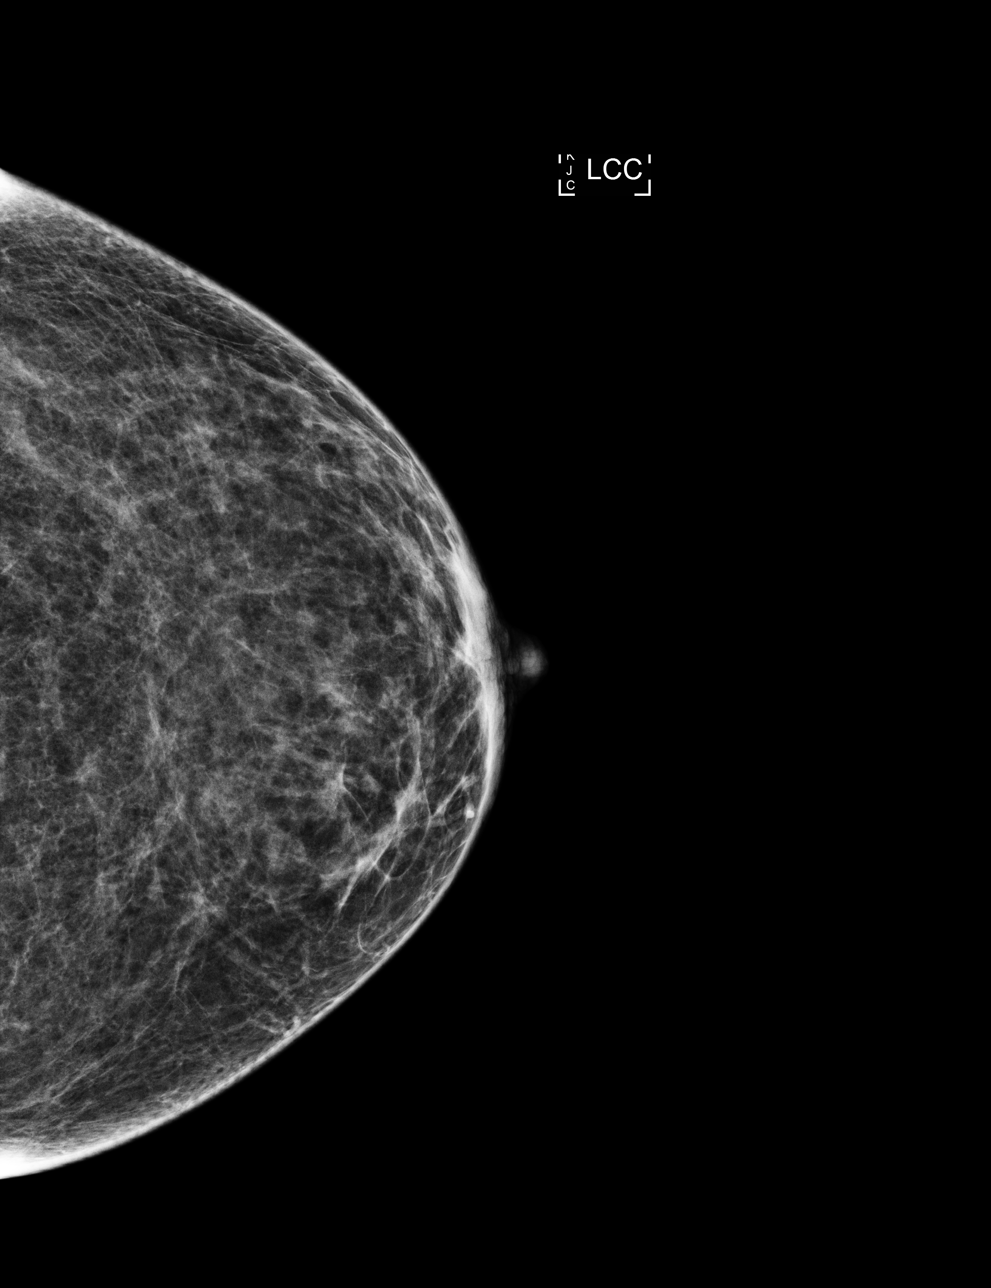

[L MLO]
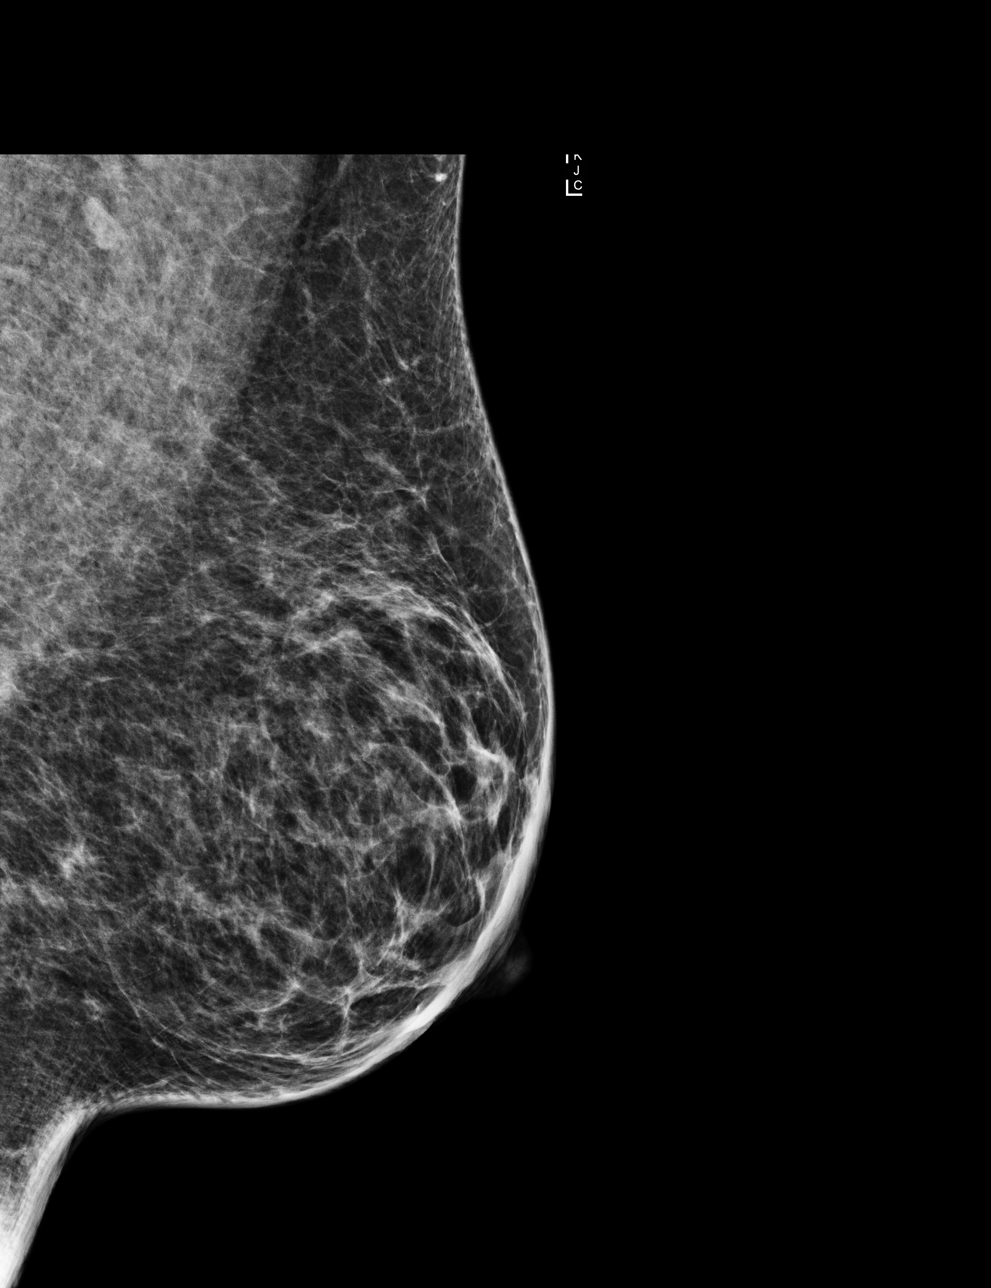

[R MLO]
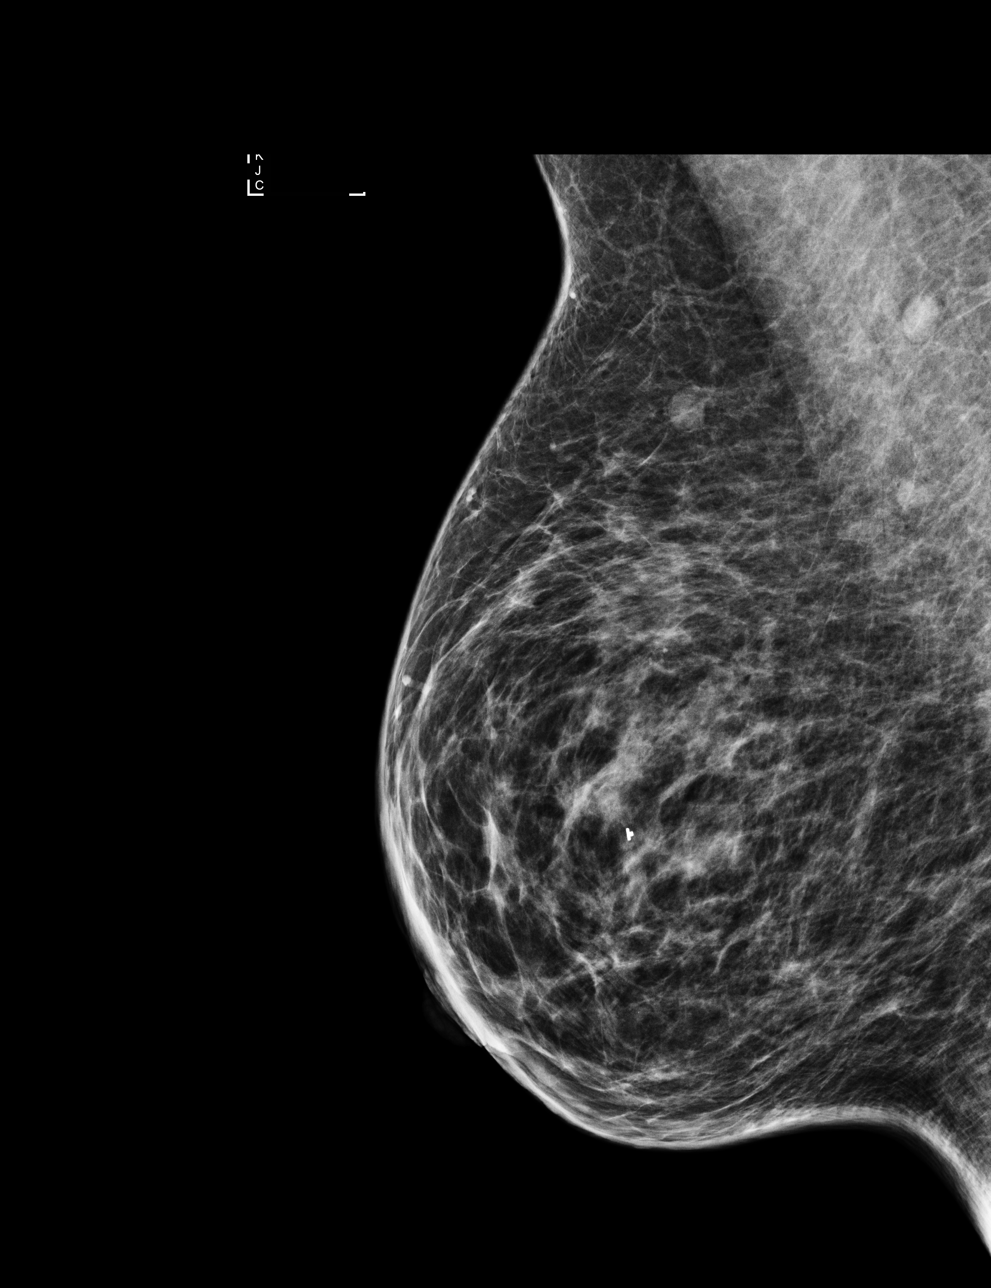

[R CC]
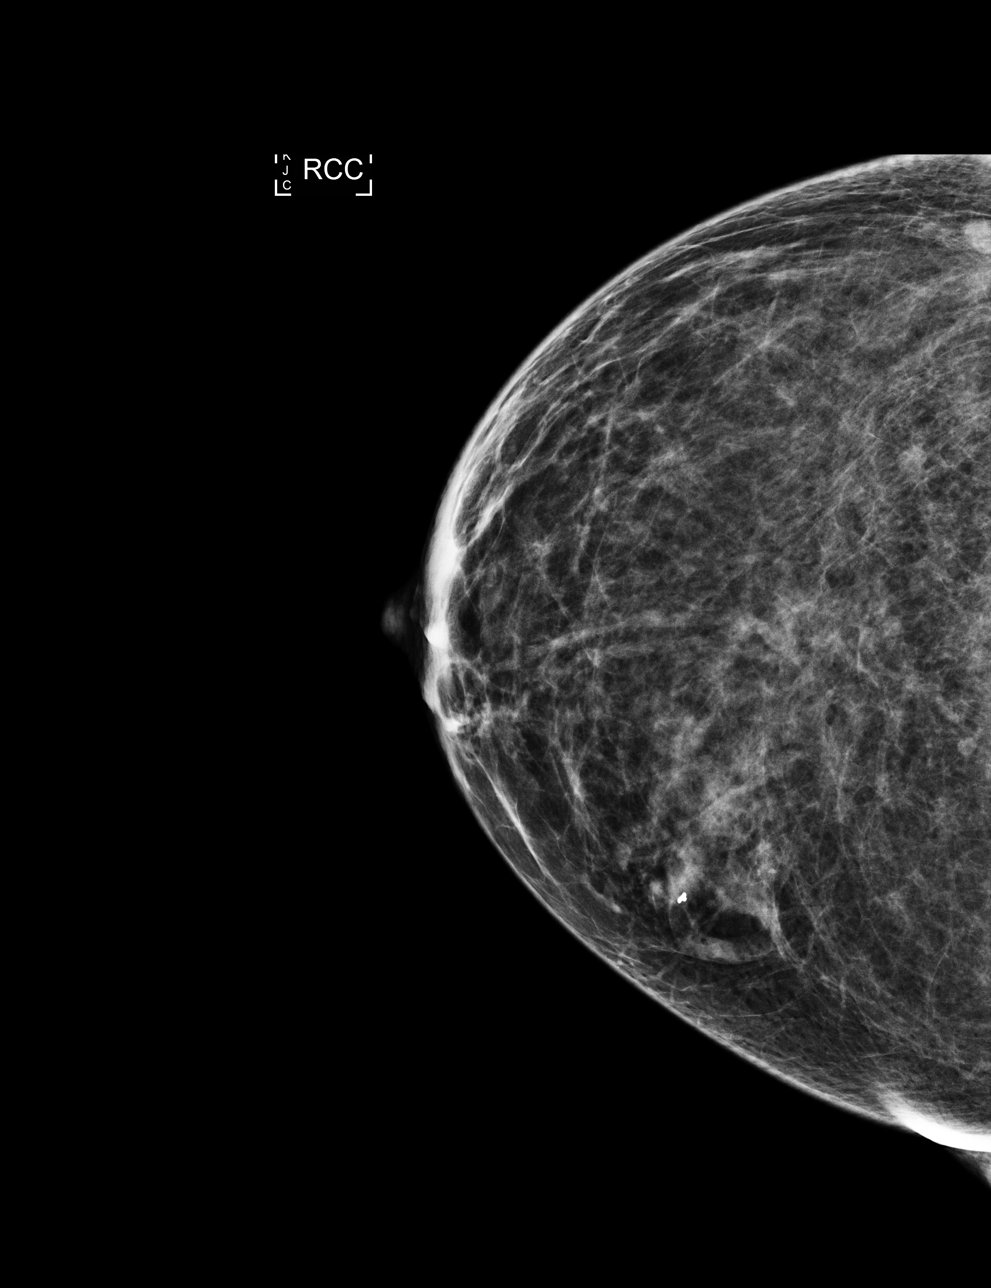

[4 of 4 positions shown; findings below may reference images not displayed]

ACR Breast Density Category c: The breast tissue is heterogeneously
dense, which may obscure small masses.
FINDINGS: There are no findings suspicious for malignancy. Images were
processed with CAD.
IMPRESSION: No mammographic evidence of malignancy. A result letter of this
screening mammogram will be mailed directly to the patient.

RECOMMENDATION:
Screening mammogram in one year. (Code:[id number])

BI-RADS CATEGORY  1: Negative.

## 2020-11-02 ENCOUNTER — Other Ambulatory Visit: Payer: Self-pay | Admitting: Obstetrics & Gynecology

## 2020-11-02 DIAGNOSIS — Z1231 Encounter for screening mammogram for malignant neoplasm of breast: Secondary | ICD-10-CM

## 2020-11-04 ENCOUNTER — Other Ambulatory Visit: Payer: Self-pay

## 2020-11-04 ENCOUNTER — Ambulatory Visit (INDEPENDENT_AMBULATORY_CARE_PROVIDER_SITE_OTHER): Payer: PRIVATE HEALTH INSURANCE | Admitting: Obstetrics & Gynecology

## 2020-11-04 ENCOUNTER — Encounter: Payer: Self-pay | Admitting: Obstetrics & Gynecology

## 2020-11-04 VITALS — BP 122/80 | Ht 63.0 in | Wt 140.0 lb

## 2020-11-04 DIAGNOSIS — Z01419 Encounter for gynecological examination (general) (routine) without abnormal findings: Secondary | ICD-10-CM | POA: Diagnosis not present

## 2020-11-04 DIAGNOSIS — Z9851 Tubal ligation status: Secondary | ICD-10-CM | POA: Diagnosis not present

## 2020-11-04 NOTE — Progress Notes (Signed)
Dominique Mendoza 06/27/1970 [id number]   History:    50 y.o. G2P2L3 Married.  S/P TL. Son 66 yo, twin sons 61 yo.  3 grand-daughters, 1 grand-son.  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   RP:  Established patient presenting for annual gyn exam   HPI: Menstrual periods every 26 days, normal flow.  Skipped 2 menstrual periods in the last year.  S/P Endometrial Ablation and TL.  No pelvic pain.  No pain with IC.  Normal vaginal secretions.  Urine/BMs normal.  Breasts normal.  BMI decreased to 24.8.  On Weight Watchers.  Walking most days.  Enjoys playing with grand-daughters and grand-son.  Fasting Health Labs with Fam MD.  Colonoscopy 2019, every 5 yrs.  Past medical history,surgical history, family history and social history were all reviewed and documented in the EPIC chart.  Gynecologic History No LMP recorded.  Obstetric History OB History  Gravida Para Term Preterm AB Living  2 2 1 1  0 3  SAB IAB Ectopic Multiple Live Births  0   0 1 3    # Outcome Date GA Lbr Len/2nd Weight Sex Delivery Anes PTL Lv  2A Preterm     M CS-Unspec  Y LIV  2B Preterm     M CS-Unspec  Y LIV  1 Term     M Vag-Spont  N LIV     ROS: A ROS was performed and pertinent positives and negatives are included in the history.  GENERAL: No fevers or chills. HEENT: No change in vision, no earache, sore throat or sinus congestion. NECK: No pain or stiffness. CARDIOVASCULAR: No chest pain or pressure. No palpitations. PULMONARY: No shortness of breath, cough or wheeze. GASTROINTESTINAL: No abdominal pain, nausea, vomiting or diarrhea, melena or bright red blood per rectum. GENITOURINARY: No urinary frequency, urgency, hesitancy or dysuria. MUSCULOSKELETAL: No joint or muscle pain, no back pain, no recent trauma. DERMATOLOGIC: No rash, no itching, no lesions. ENDOCRINE: No polyuria, polydipsia, no heat or cold intolerance. No recent change in weight. HEMATOLOGICAL: No anemia  or easy bruising or bleeding. NEUROLOGIC: No headache, seizures, numbness, tingling or weakness. PSYCHIATRIC: No depression, no loss of interest in normal activity or change in sleep pattern.     Exam:   BP 122/80 (BP Location: Left Arm, Patient Position: Sitting, Cuff Size: Normal)   Ht 5\' 3"  (1.6 m)   Wt 140 lb (63.5 kg)   BMI 24.80 kg/m   Body mass index is 24.8 kg/m.  General appearance : Well developed well nourished female. No acute distress HEENT: Eyes: no retinal hemorrhage or exudates,  Neck supple, trachea midline, no carotid bruits, no thyroidmegaly Lungs: Clear to auscultation, no rhonchi or wheezes, or rib retractions  Heart: Regular rate and rhythm, no murmurs or gallops Breast:Examined in sitting and supine position were symmetrical in appearance, no palpable masses or tenderness,  no skin retraction, no nipple inversion, no nipple discharge, no skin discoloration, no axillary or supraclavicular lymphadenopathy Abdomen: no palpable masses or tenderness, no rebound or guarding Extremities: no edema or skin discoloration or tenderness  Pelvic: Vulva: Normal             Vagina: No gross lesions or discharge  Cervix: No gross lesions or discharge  Uterus  AV, normal size, shape and consistency, non-tender and mobile  Adnexa  Without masses or tenderness  Anus: Normal   Assessment/Plan:  50 y.o. female for annual exam   1. Well female  exam with routine gynecological exam Normal gynecologic exam.  Menstrual periods every month with normal flow.  Skipped 2 periods in the last year.  status post endometrial ablation.  Pap test negative in 2020, will repeat next year at 3 years.  Breast exam normal.  Screening mammogram scheduled for early August 2022.  Colonoscopy 2019, will repeat at 5 years.  Health labs with family physician.  Weight loss with weight watcher, very good body mass index currently at 24.8.  Continue with weight watcher and fitness activities.  2. S/P tubal  ligation   [name] MD, 10:19 AM 11/04/2020

## 2020-12-27 ENCOUNTER — Ambulatory Visit: Payer: PRIVATE HEALTH INSURANCE

## 2021-02-15 ENCOUNTER — Other Ambulatory Visit: Payer: Self-pay

## 2021-02-15 ENCOUNTER — Ambulatory Visit
Admission: RE | Admit: 2021-02-15 | Discharge: 2021-02-15 | Disposition: A | Discharge status: Home / Self Care | Payer: No Typology Code available for payment source | Source: Ambulatory Visit | Attending: Obstetrics & Gynecology | Admitting: Obstetrics & Gynecology

## 2021-02-15 DIAGNOSIS — Z1231 Encounter for screening mammogram for malignant neoplasm of breast: Secondary | ICD-10-CM

## 2021-06-20 ENCOUNTER — Telehealth: Payer: Self-pay

## 2021-06-20 ENCOUNTER — Other Ambulatory Visit: Payer: Self-pay

## 2021-06-20 DIAGNOSIS — N939 Abnormal uterine and vaginal bleeding, unspecified: Secondary | ICD-10-CM

## 2021-06-20 DIAGNOSIS — N921 Excessive and frequent menstruation with irregular cycle: Secondary | ICD-10-CM

## 2021-06-20 MED ORDER — MEGESTROL ACETATE 40 MG PO TABS: ORAL_TABLET | ORAL | 1 refills | Status: DC

## 2021-06-20 NOTE — Telephone Encounter (Signed)
Rx sent. Order for u/s placed and message sent to front desk.  When I spoke with patient she was out for a walk and said bleeding had slowed considerably as the day went on. After we spoke at 10am she did not change her tampon until noon and it was not close to saturated.

## 2021-06-20 NOTE — Telephone Encounter (Signed)
Per Dr. Dellis Filbert "S/P Tubal Ligation.  Start on Megace 40 mg PO BID #60 refill x 1 to slow down the bleeding.  Schedule a Pelvic US with me. "

## 2021-06-20 NOTE — Telephone Encounter (Signed)
Ultrasound and visit scheduled 06/21/21.

## 2021-06-20 NOTE — Telephone Encounter (Signed)
51 yo with history of endometrial ablation. She called today with heavy bleeding. She reports no menses since she spotted a few days in June 2022.  Them Friday 06/10/20 she started spotting and it continued and at times was heavy until yesterday afternoon became very heavy. She was okay overnight but is saturating a super plus tampon every hour this morning.  Over the last ten days she mentioned that she had passed a few very large clots as well.   Please advise.

## 2021-06-21 ENCOUNTER — Other Ambulatory Visit: Payer: Self-pay

## 2021-06-21 ENCOUNTER — Encounter: Payer: Self-pay | Admitting: Obstetrics & Gynecology

## 2021-06-21 ENCOUNTER — Ambulatory Visit (INDEPENDENT_AMBULATORY_CARE_PROVIDER_SITE_OTHER): Payer: PRIVATE HEALTH INSURANCE | Admitting: Obstetrics & Gynecology

## 2021-06-21 ENCOUNTER — Ambulatory Visit (INDEPENDENT_AMBULATORY_CARE_PROVIDER_SITE_OTHER): Payer: PRIVATE HEALTH INSURANCE

## 2021-06-21 VITALS — BP 120/80

## 2021-06-21 DIAGNOSIS — N921 Excessive and frequent menstruation with irregular cycle: Secondary | ICD-10-CM | POA: Diagnosis not present

## 2021-06-21 DIAGNOSIS — R9389 Abnormal findings on diagnostic imaging of other specified body structures: Secondary | ICD-10-CM | POA: Diagnosis not present

## 2021-06-21 DIAGNOSIS — Z9851 Tubal ligation status: Secondary | ICD-10-CM | POA: Diagnosis not present

## 2021-06-21 DIAGNOSIS — N939 Abnormal uterine and vaginal bleeding, unspecified: Secondary | ICD-10-CM | POA: Diagnosis not present

## 2021-06-21 DIAGNOSIS — Z9889 Other specified postprocedural states: Secondary | ICD-10-CM

## 2021-06-21 NOTE — Progress Notes (Signed)
° ° °  Dominique Mendoza 04-Dec-1970 [id number]        51 y.o.  G2P1103 S/P TL.  RP: Heavy menses on-off x 06/10/2021  HPI: Oligomenorrhagia.  Had a light menstrual period in 10/2020.  Then no bleeding until 06/10/2021.  Variable amount of vaginal bleeding x 06/10/2021, very heavy with overflow x 2 days.  Started on Megace 40 mg BID yesterday with much improved bleeding now, very light this morning.  S/P Endometrial Ablation.  No pelvic pain.  No pain with IC.  Normal vaginal secretions.  Urine/BMs normal. No fever.   OB History  Gravida Para Term Preterm AB Living  2 2 1 1  0 3  SAB IAB Ectopic Multiple Live Births  0   0 1 3    # Outcome Date GA Lbr Len/2nd Weight Sex Delivery Anes PTL Lv  2A Preterm     M CS-Unspec  Y LIV  2B Preterm     M CS-Unspec  Y LIV  1 Term     M Vag-Spont  N LIV    Past medical history,surgical history, problem list, medications, allergies, family history and social history were all reviewed and documented in the EPIC chart.   Directed ROS with pertinent positives and negatives documented in the history of present illness/assessment and plan.  Exam:  Vitals:   06/21/21 1134  BP: 120/80   General appearance:  Normal  Pelvic US today: T/V images.  Anteverted enlarged uterus with an anterior lateral left subserosal fibroid measured at 3.5 x 4.4 cm.  The overall uterine size is measured at 10.76 x 7.13 x 6.14 cm.  Thickened symmetrical endometrium measured at 14.9 mm.  Mixed echogenicity avascular area, probable endometrial polyp versus submucosal myoma measured at 1.6 x 1.1 cm.  Trace amount of fluid within the uterine cavity.  Right ovary with an avascular simple cyst measured at 2.8 x 3.7 cm.  Left ovary with a single follicle noted.  No free fluid in the pelvis.   Assessment/Plan:  51 y.o. [id number]   1. Menorrhagia with irregular cycle Oligomenorrhagia.  Had a light menstrual period in 10/2020.  Then no bleeding until 06/10/2021.  Variable amount of vaginal  bleeding x 06/10/2021, very heavy with overflow x 2 days.  Started on Megace 40 mg BID yesterday with much improved bleeding now, very light this morning.  S/P Endometrial Ablation.  No pelvic pain.  Pelvic US findings thoroughly reviewed with patient.  Probable endometrial polyp or SM Myoma.  Given her s/p Endometrial Ablation, decision to proceed with a Sonohystogram.  If an endometrial lesion is documented and the cavity is accessible, we may proceed with a HSC/Myosure/D+C, although patient is considering a Hysterectomy at this point. - [place] Sonohysterogram; Future  2. Thickened endometrium Probable Endometrial Polyp or SM Myoma.  Will further investigate with a Sonohysto. - [place] Sonohysterogram; Future  3. S/P endometrial ablation  4. S/P tubal ligation  Other orders - SYNTHROID 137 MCG tablet; Take 137 mcg by mouth daily. - Bisacodyl (LAXATIVE PO); Take by mouth. - FIBER PO; Take by mouth.   [name] MD, 11:48 AM 06/21/2021

## 2021-08-04 ENCOUNTER — Ambulatory Visit (INDEPENDENT_AMBULATORY_CARE_PROVIDER_SITE_OTHER): Payer: PRIVATE HEALTH INSURANCE | Admitting: Obstetrics & Gynecology

## 2021-08-04 ENCOUNTER — Other Ambulatory Visit: Payer: Self-pay

## 2021-08-04 ENCOUNTER — Telehealth: Payer: Self-pay

## 2021-08-04 ENCOUNTER — Ambulatory Visit (INDEPENDENT_AMBULATORY_CARE_PROVIDER_SITE_OTHER): Payer: PRIVATE HEALTH INSURANCE

## 2021-08-04 DIAGNOSIS — R9389 Abnormal findings on diagnostic imaging of other specified body structures: Secondary | ICD-10-CM

## 2021-08-04 DIAGNOSIS — N921 Excessive and frequent menstruation with irregular cycle: Secondary | ICD-10-CM

## 2021-08-04 DIAGNOSIS — D250 Submucous leiomyoma of uterus: Secondary | ICD-10-CM

## 2021-08-04 NOTE — Telephone Encounter (Signed)
-----   Message from [name], MD sent at 08/04/2021  3:00 PM EST ----- ?Regarding: Schedule surgery ?Surgery: CPT [phone] - Hysteroscopy/D&C/Myosure ? ?Diagnosis: N93.9 Abnormal Uterine Bleeding, R93.89 Thickened Endometrium, D25.9 Fibroids ? ?Location: [facility] ? ?Status: Outpatient ? ?Time: 60 Minutes ? ?Assistant: N/A ? ?Urgency: First Available ? ?Pre-Op Appointment: To Be Scheduled ? ?Post-Op Appointment(s): 2 Weeks ? ?Time Out Of Work: Day Of Surgery, 1 Day Post Op  ? ?

## 2021-08-04 NOTE — Progress Notes (Signed)
? ? ?Dominique Mendoza [birth year] [id number] ? ? ?     51 y.o.  [id number]  ? ?RP: Menometrorrhagia with thickened endometrium for Sonohysterogram ? ?HPI: Variable amount of vaginal bleeding x 06/10/2021, very heavy with overflow x 2 days.  Started on Megace 40 mg BID at the end of January 2023 with much improved bleeding.  S/P Endometrial Ablation.  Pelvic US 06/21/2021 showed an endometrial line at 14.9 mm with a probable IU lesion. ? ? ?OB History  ?Gravida Para Term Preterm AB Living  ?'2 2 1 1 '$ 0 3  ?SAB IAB Ectopic Multiple Live Births  ?0   0 1 3  ?  ?# Outcome Date GA Lbr Len/2nd Weight Sex Delivery Anes PTL Lv  ?2A Preterm     M CS-Unspec  Y LIV  ?2B Preterm     M CS-Unspec  Y LIV  ?1 Term     M Vag-Spont  N LIV  ? ? ?Past medical history,surgical history, problem list, medications, allergies, family history and social history were all reviewed and documented in the EPIC chart. ? ? ?Directed ROS with pertinent positives and negatives documented in the history of present illness/assessment and plan. ? ?Exam: ? ?There were no vitals filed for this visit. ?General appearance:  Normal ? ?                                                                  Sono Infusion Hysterogram ( procedure note) ? ? ?The initial transvaginal ultrasound demonstrated the following: ? ?Anteverted uterus.  Anterior Subserosal fibroid measured at 5.1 x 4.5 cm. ? ? ?The speculum  was inserted and the cervix cleansed with Betadine solution after confirming that patient has no allergies.A small sonohysterography catheterwas utilized.  Insertion was facilitated with ring forceps, using a spear-like motion the catheter was inserted to the fundus of the uterus. The speculum is then removed carefully to avoid dislodging the catheter. ?The catheter was flushed with sterile saline delete prior to insertion to rid it of small amounts of air.the sterile saline solution was infused into the uterine cavity as a vaginal ultrasound probe was then placed in  the vagina for full visualization of the uterine cavity from a transvaginal approach. The following was noted: ? ?Injection of saline.  Intracavitary mass measured at 1.5 x 1.3 cm c/w a Submucosal Fibroid. ? ?The catheter was then removed after retrieving some of the saline from the intrauterine cavity. An endometrial biopsy not done. Patient tolerated procedure well. She had received a tablet of Aleve for discomfort.   ? ? ?Assessment/Plan:  51 y.o. [id number]  ? ?1. Menorrhagia with irregular cycle ?Variable amount of vaginal bleeding x 06/10/2021, very heavy with overflow x 2 days.  Started on Megace 40 mg BID at the end of January 2023 with much improved bleeding.  S/P Endometrial Ablation.  Pelvic US 06/21/2021 showed an endometrial line at 14.9 mm with a probable IU lesion.  Sonohysterogram today confirming an intracavitary lesion measured at 1.5 x 1.3 cm c/w a Submucosal Fibroid.  Will proceed with a [place] Myosure Excision and D+C.  Pamphlet given.  Surgery explained.  F/U Preop visit. ? ?2. Thickened endometrium ?D/t SM Fibroid. ? ?3. Fibroids, submucosal  ?1.5 x 1.3 cm ? ?  Counseling on the management of Submucosal Fibroid associated with Menometrorrhagia for 20 minutes. ? ?[name] MD, 2:58 PM 08/04/2021 ? ? ? ?  ?

## 2021-08-05 ENCOUNTER — Encounter: Payer: Self-pay | Admitting: Obstetrics & Gynecology

## 2021-08-05 ENCOUNTER — Telehealth: Payer: Self-pay | Admitting: *Deleted

## 2021-08-05 NOTE — Telephone Encounter (Signed)
Patient called and left message in triage voicemail with questions, I called patient and received her voicemail. I left message for patient to call. ?

## 2021-08-05 NOTE — Telephone Encounter (Signed)
Spoke with patient. Reviewed surgery dates. Patient request to proceed with surgery on 08/30/21.  Advised patient I will forward to business office for return call. I will return call once surgery date and time confirmed. Patient verbalizes understanding and is agreeable.  ? ?Surgery request sent.  ? ?

## 2021-08-08 NOTE — Telephone Encounter (Signed)
Left message to call Halim Surrette, RN at [facility], [phone].  

## 2021-08-08 NOTE — Telephone Encounter (Signed)
Dr.Lavoie replied "Increase Megace to 2 tab PO BID today and tomorrow, then resume 1 tab PO BID.  If no improvement by Wednesday, schedule a visit with me. " ? ?Patient informed, I asked her to follow up Wedneday am if no improvement.  Patient verbalized she understood.  ?

## 2021-08-08 NOTE — Telephone Encounter (Signed)
Patient called had [facility] on 08/04/21 reports having bleeding post [facility], she knew to expect this. However on Friday bleeding increased she called the office and I called her back and never heard back from patient. She was in voicemail today stating over the weekend bleeding increased to heavy, wearing pads changing every 2 hours, has changed every 1 hour over the weekend, passed a golf size clot,  smaller clots now and intense cramping. Taking OTC ibuprofen and using heating pad.(She does not want Rx for pain)  Patient reports she before that golf size clots passed she felt what felt like child birth contracts. Scheduled for D&C on 08/30/21, she is concerned about bleeding, still taking megace 40 mg tablet 1 po bid as recommended. Patient would like recommendations. Please advise  ?

## 2021-08-09 NOTE — Telephone Encounter (Signed)
Spoke with patient. Surgery date request confirmed.  ?Advised surgery is scheduled for 08/30/21, [facility] at 1045. ?Surgery instruction sheet and hospital brochure reviewed, printed copy will be mailed.  ?Patient advised if Covid screening and quarantine requirements and agreeable.  ? ?Routing to provider. Encounter closed.  ?Cc: Hayley Carder ?

## 2021-08-10 ENCOUNTER — Telehealth: Payer: Self-pay

## 2021-08-10 NOTE — Telephone Encounter (Signed)
F/u from telephone encounter: 08/08/21.  ?Sonohysterogram on 08/04/21.  ?Pt calling to report doubling up on megace on Monday/Tuesday, has taken one today. ?Pt reports still continuance of moderate bleeding. Going through pad/tampon q3-4 hrs. Reports mild cramping yesterday. Wanted to update you. Denies any other concerns. States "im not necessarily sure at this point why she should need and appt since [facility] scheduled for 08/30/21." Advised her I would still update Dr. Dellis Filbert and get any advise/recommendations. Pt voiced understanding.  ?

## 2021-08-12 NOTE — Telephone Encounter (Signed)
Per ML ? ?"Continue with Megace until surgery 08/30/2021.  No need for appointment if bleeding remains stable or improves."  ? ?Pt notified and voiced understanding.  ?

## 2021-08-17 ENCOUNTER — Ambulatory Visit (INDEPENDENT_AMBULATORY_CARE_PROVIDER_SITE_OTHER): Payer: PRIVATE HEALTH INSURANCE | Admitting: Obstetrics & Gynecology

## 2021-08-17 ENCOUNTER — Other Ambulatory Visit: Payer: Self-pay

## 2021-08-17 ENCOUNTER — Encounter: Payer: Self-pay | Admitting: Obstetrics & Gynecology

## 2021-08-17 VITALS — BP 110/78 | Ht 62.75 in | Wt 145.0 lb

## 2021-08-17 DIAGNOSIS — D250 Submucous leiomyoma of uterus: Secondary | ICD-10-CM

## 2021-08-17 DIAGNOSIS — N921 Excessive and frequent menstruation with irregular cycle: Secondary | ICD-10-CM | POA: Diagnosis not present

## 2021-08-17 DIAGNOSIS — R9389 Abnormal findings on diagnostic imaging of other specified body structures: Secondary | ICD-10-CM

## 2021-08-17 LAB — CBC
HCT: 42.9 % (ref 35.0–45.0)
Hemoglobin: 14.3 g/dL (ref 11.7–15.5)
MCH: 30.6 pg (ref 27.0–33.0)
MCHC: 33.3 g/dL (ref 32.0–36.0)
MCV: 91.7 fL (ref 80.0–100.0)
MPV: 9.5 fL (ref 7.5–12.5)
Platelets: 254 10*3/uL (ref 140–400)
RBC: 4.68 10*6/uL (ref 3.80–5.10)
RDW: 12.2 % (ref 11.0–15.0)
WBC: 7.3 10*3/uL (ref 3.8–10.8)

## 2021-08-17 NOTE — Progress Notes (Signed)
? ? ?Dominique Mendoza Apr 23, 1971 [id number] ? ? ?     51 y.o.  G2P2L3 Married ? ?RP: Preop HSC/D+C/Myosure Excision on 08/30/2021 ? ?HPI: Variable amount of vaginal bleeding x 06/10/2021, very heavy with overflow x 2 days.  Started on Megace 40 mg BID at the end of January 2023 with much improved bleeding.  S/P Endometrial Ablation.  Pelvic US 06/21/2021 showed an endometrial line at 14.9 mm with a probable IU lesion.  SonoHysto confirmed a 1.5 cm lesion c/w a SM Fibroid.  Had heavy bleeding post Sonohysto, now only spotting. ?  ? ? ?OB History  ?Gravida Para Term Preterm AB Living  ?'2 2 1 1 '$ 0 3  ?SAB IAB Ectopic Multiple Live Births  ?0   0 1 3  ?  ?# Outcome Date GA Lbr Len/2nd Weight Sex Delivery Anes PTL Lv  ?2A Preterm     M CS-Unspec  Y LIV  ?2B Preterm     M CS-Unspec  Y LIV  ?1 Term     M Vag-Spont  N LIV  ? ? ?Past medical history,surgical history, problem list, medications, allergies, family history and social history were all reviewed and documented in the EPIC chart. ? ? ?Directed ROS with pertinent positives and negatives documented in the history of present illness/assessment and plan. ? ?Exam: ? ?Vitals:  ? 08/17/21 1154  ?BP: 110/78  ?Weight: 145 lb (65.8 kg)  ?Height: 5' 2.75" (1.594 m)  ? ?General appearance:  Normal ? ?                                                                  Sono Infusion Hysterogram ( procedure note) ?  ?  ?The initial transvaginal ultrasound demonstrated the following: ?  ?Anteverted uterus.  Anterior Subserosal fibroid measured at 5.1 x 4.5 cm. ?  ?  ?The speculum  was inserted and the cervix cleansed with Betadine solution after confirming that patient has no allergies.A small sonohysterography catheterwas utilized.  Insertion was facilitated with ring forceps, using a spear-like motion the catheter was inserted to the fundus of the uterus. The speculum is then removed carefully to avoid dislodging the catheter. ?The catheter was flushed with sterile saline delete prior to  insertion to rid it of small amounts of air.the sterile saline solution was infused into the uterine cavity as a vaginal ultrasound probe was then placed in the vagina for full visualization of the uterine cavity from a transvaginal approach. The following was noted: ?  ?Injection of saline.  Intracavitary mass measured at 1.5 x 1.3 cm c/w a Submucosal Fibroid. ?  ?The catheter was then removed after retrieving some of the saline from the intrauterine cavity. An endometrial biopsy not done. Patient tolerated procedure well. She had received a tablet of Aleve for discomfort.   ?  ?  ?Assessment/Plan:  51 y.o. [id number]  ?  ?1. Menorrhagia with irregular cycle ?Variable amount of vaginal bleeding x 06/10/2021, very heavy with overflow at times.  Started on Megace 40 mg BID at the end of January 2023 with much improved bleeding.  S/P Endometrial Ablation.  Pelvic US 06/21/2021 showed an endometrial line at 14.9 mm with a probable IU lesion.  Sonohysterogram today confirming an intracavitary lesion measured at 1.5  x 1.3 cm c/w a Submucosal Fibroid. [place] Myosure Excision and D+C scheduled 08/30/2021.  Preop preparation, surgery and risks thoroughly reviewed including uterine perforation, postop precautions and expectations reviewed.  Had heavy bleeding post Sonohysto, will check CBC today.  Just spotting now. ?- CBC ? ?2. Thickened endometrium ?D/t SM Fibroid. ?  ?3. Fibroids, submucosal  ?1.5 x 1.3 cm ? ?                      Patient was counseled as to the risk of surgery to include the following: ? ?1. Infection (prohylactic antibiotics will be administered) ? ?2. DVT/Pulmonary Embolism (prophylactic pneumo compression stockings will be used) ? ?3.Trauma to internal organs requiring additional surgical procedure to repair any injury to internal organs requiring perhaps additional hospitalization days. ? ?4.Hemmorhage requiring transfusion and blood products which carry risks such as  anaphylactic reaction, hepatitis and  AIDS ? ?Patient had received literature information on the procedure scheduled and all her questions were answered and fully accepts all risk. ? ?  ?[name] MD, 12:21 PM 08/17/2021 ? ? ? ?  ?

## 2021-08-24 ENCOUNTER — Other Ambulatory Visit: Payer: Self-pay

## 2021-08-24 ENCOUNTER — Encounter (HOSPITAL_BASED_OUTPATIENT_CLINIC_OR_DEPARTMENT_OTHER): Payer: Self-pay | Admitting: Obstetrics & Gynecology

## 2021-08-24 NOTE — Progress Notes (Signed)
Spoke w/ via phone for pre-op interview---Sheriann ?Lab needs dos----CBC, urine pregnancy per surgeon             ?Lab results------08/17/21 CBC in Epic ?COVID test -----patient states asymptomatic no test needed ?Arrive at -------0900 on Tuesday, 08/30/21 ?NPO after MN NO Solid Food.  Clear liquids from MN until--0800 ?Med rec completed ?Medications to take morning of surgery -----Acyclovir prn, Singulair, Prilosec, Synthroid, Effexor ?Diabetic medication -----n/a ?Patient instructed no nail polish to be worn day of surgery ?Patient instructed to bring photo id and insurance card day of surgery ?Patient aware to have Driver (ride ) / caregiver    for 24 hours after surgery - husband Roland ?Patient Special Instructions -----none ?Pre-Op special Istructions -----none ?Patient verbalized understanding of instructions that were given at this phone interview. ?Patient denies shortness of breath, chest pain, fever, cough at this phone interview.  ?

## 2021-08-30 ENCOUNTER — Other Ambulatory Visit: Payer: Self-pay

## 2021-08-30 ENCOUNTER — Ambulatory Visit (HOSPITAL_BASED_OUTPATIENT_CLINIC_OR_DEPARTMENT_OTHER)
Admission: RE | Admit: 2021-08-30 | Discharge: 2021-08-30 | Disposition: A | Discharge status: Home / Self Care | Payer: PRIVATE HEALTH INSURANCE | Attending: Obstetrics & Gynecology | Admitting: Obstetrics & Gynecology

## 2021-08-30 ENCOUNTER — Ambulatory Visit (HOSPITAL_BASED_OUTPATIENT_CLINIC_OR_DEPARTMENT_OTHER): Payer: PRIVATE HEALTH INSURANCE | Admitting: Anesthesiology

## 2021-08-30 ENCOUNTER — Encounter (HOSPITAL_BASED_OUTPATIENT_CLINIC_OR_DEPARTMENT_OTHER): Payer: Self-pay | Admitting: Obstetrics & Gynecology

## 2021-08-30 ENCOUNTER — Encounter (HOSPITAL_BASED_OUTPATIENT_CLINIC_OR_DEPARTMENT_OTHER)
Admission: RE | Disposition: A | Discharge status: Home / Self Care | Payer: Self-pay | Source: Home / Self Care | Attending: Obstetrics & Gynecology

## 2021-08-30 DIAGNOSIS — E039 Hypothyroidism, unspecified: Secondary | ICD-10-CM | POA: Insufficient documentation

## 2021-08-30 DIAGNOSIS — Z79899 Other long term (current) drug therapy: Secondary | ICD-10-CM | POA: Diagnosis not present

## 2021-08-30 DIAGNOSIS — Z01818 Encounter for other preprocedural examination: Secondary | ICD-10-CM

## 2021-08-30 DIAGNOSIS — D259 Leiomyoma of uterus, unspecified: Secondary | ICD-10-CM | POA: Diagnosis not present

## 2021-08-30 DIAGNOSIS — R9389 Abnormal findings on diagnostic imaging of other specified body structures: Secondary | ICD-10-CM | POA: Insufficient documentation

## 2021-08-30 DIAGNOSIS — N939 Abnormal uterine and vaginal bleeding, unspecified: Secondary | ICD-10-CM | POA: Diagnosis not present

## 2021-08-30 DIAGNOSIS — Z79890 Hormone replacement therapy (postmenopausal): Secondary | ICD-10-CM | POA: Diagnosis not present

## 2021-08-30 DIAGNOSIS — K219 Gastro-esophageal reflux disease without esophagitis: Secondary | ICD-10-CM | POA: Insufficient documentation

## 2021-08-30 DIAGNOSIS — Z8616 Personal history of COVID-19: Secondary | ICD-10-CM | POA: Insufficient documentation

## 2021-08-30 HISTORY — DX: Presence of spectacles and contact lenses: Z97.3

## 2021-08-30 HISTORY — PX: DILATATION & CURETTAGE/HYSTEROSCOPY WITH MYOSURE: SHX6511

## 2021-08-30 HISTORY — DX: Anxiety disorder, unspecified: F41.9

## 2021-08-30 LAB — POCT PREGNANCY, URINE: Preg Test, Ur: NEGATIVE

## 2021-08-30 SURGERY — DILATATION & CURETTAGE/HYSTEROSCOPY WITH MYOSURE
Anesthesia: General

## 2021-08-30 MED ORDER — POVIDONE-IODINE 10 % EX SWAB: 2.0000 "application " | Freq: Once | CUTANEOUS | Status: DC

## 2021-08-30 MED ORDER — ACETAMINOPHEN 500 MG PO TABS
1000.0000 mg | ORAL_TABLET | Freq: Once | ORAL | Status: AC
  Administered 2021-08-30: 1000 mg via ORAL

## 2021-08-30 MED ORDER — CEFAZOLIN SODIUM-DEXTROSE 2-4 GM/100ML-% IV SOLN
INTRAVENOUS | Status: AC
  Filled 2021-08-30: qty 100

## 2021-08-30 MED ORDER — KETOROLAC TROMETHAMINE 30 MG/ML IJ SOLN
INTRAMUSCULAR | Status: DC | PRN
Start: 2021-08-30 — End: 2021-08-30
  Administered 2021-08-30: 30 mg via INTRAVENOUS

## 2021-08-30 MED ORDER — SODIUM CHLORIDE 0.9 % IR SOLN
Status: DC | PRN
  Administered 2021-08-30: 3000 mL

## 2021-08-30 MED ORDER — SCOPOLAMINE 1 MG/3DAYS TD PT72
MEDICATED_PATCH | TRANSDERMAL | Status: AC
  Filled 2021-08-30: qty 1

## 2021-08-30 MED ORDER — FENTANYL CITRATE (PF) 100 MCG/2ML IJ SOLN: 25.0000 ug | INTRAMUSCULAR | Status: DC | PRN

## 2021-08-30 MED ORDER — ACETAMINOPHEN 500 MG PO TABS
ORAL_TABLET | ORAL | Status: AC
  Filled 2021-08-30: qty 2

## 2021-08-30 MED ORDER — LACTATED RINGERS IV SOLN: INTRAVENOUS | Status: DC

## 2021-08-30 MED ORDER — PROPOFOL 1000 MG/100ML IV EMUL
INTRAVENOUS | Status: AC
  Filled 2021-08-30: qty 200

## 2021-08-30 MED ORDER — MIDAZOLAM HCL 2 MG/2ML IJ SOLN
INTRAMUSCULAR | Status: AC
  Filled 2021-08-30: qty 2

## 2021-08-30 MED ORDER — PHENYLEPHRINE HCL (PRESSORS) 10 MG/ML IV SOLN
INTRAVENOUS | Status: DC | PRN
  Administered 2021-08-30 (×2): 80 ug via INTRAVENOUS

## 2021-08-30 MED ORDER — LIDOCAINE HCL 1 % IJ SOLN
INTRAMUSCULAR | Status: DC | PRN
Start: 2021-08-30 — End: 2021-08-30
  Administered 2021-08-30: 20 mL

## 2021-08-30 MED ORDER — AMISULPRIDE (ANTIEMETIC) 5 MG/2ML IV SOLN: 10.0000 mg | Freq: Once | INTRAVENOUS | Status: DC | PRN

## 2021-08-30 MED ORDER — FENTANYL CITRATE (PF) 100 MCG/2ML IJ SOLN
INTRAMUSCULAR | Status: DC | PRN
  Administered 2021-08-30: 50 ug via INTRAVENOUS

## 2021-08-30 MED ORDER — MIDAZOLAM HCL 2 MG/2ML IJ SOLN
INTRAMUSCULAR | Status: DC | PRN
  Administered 2021-08-30: 1 mg via INTRAVENOUS

## 2021-08-30 MED ORDER — DEXAMETHASONE SODIUM PHOSPHATE 4 MG/ML IJ SOLN
INTRAMUSCULAR | Status: DC | PRN
  Administered 2021-08-30: 10 mg via INTRAVENOUS

## 2021-08-30 MED ORDER — LIDOCAINE HCL (CARDIAC) PF 100 MG/5ML IV SOSY
PREFILLED_SYRINGE | INTRAVENOUS | Status: DC | PRN
  Administered 2021-08-30: 20 mg via INTRAVENOUS

## 2021-08-30 MED ORDER — EPHEDRINE SULFATE (PRESSORS) 50 MG/ML IJ SOLN
INTRAMUSCULAR | Status: DC | PRN
  Administered 2021-08-30: 5 mg via INTRAVENOUS

## 2021-08-30 MED ORDER — CEFAZOLIN SODIUM-DEXTROSE 2-4 GM/100ML-% IV SOLN
2.0000 g | INTRAVENOUS | Status: AC
  Administered 2021-08-30: 2 g via INTRAVENOUS

## 2021-08-30 MED ORDER — PROPOFOL 10 MG/ML IV BOLUS
INTRAVENOUS | Status: DC | PRN
  Administered 2021-08-30: 150 mg via INTRAVENOUS

## 2021-08-30 MED ORDER — ONDANSETRON HCL 4 MG/2ML IJ SOLN
INTRAMUSCULAR | Status: DC | PRN
  Administered 2021-08-30: 4 mg via INTRAVENOUS

## 2021-08-30 MED ORDER — SILVER NITRATE-POT NITRATE 75-25 % EX MISC
CUTANEOUS | Status: DC | PRN
  Administered 2021-08-30: 4

## 2021-08-30 MED ORDER — SCOPOLAMINE 1 MG/3DAYS TD PT72
1.0000 | MEDICATED_PATCH | TRANSDERMAL | Status: DC
  Administered 2021-08-30: 1.5 mg via TRANSDERMAL

## 2021-08-30 SURGICAL SUPPLY — 23 items
BAG PRESSURE INF REUSE 3000 (BAG) ×1 IMPLANT
CATH ROBINSON RED A/P 16FR (CATHETERS) ×3 IMPLANT
DEVICE MYOSURE LITE (MISCELLANEOUS) IMPLANT
DEVICE MYOSURE REACH (MISCELLANEOUS) ×1 IMPLANT
DILATOR CANAL MILEX (MISCELLANEOUS) IMPLANT
DRSG TELFA 3X8 NADH (GAUZE/BANDAGES/DRESSINGS) ×2 IMPLANT
ELECT REM PT RETURN 9FT ADLT (ELECTROSURGICAL)
ELECTRODE REM PT RTRN 9FT ADLT (ELECTROSURGICAL) IMPLANT
GAUZE 4X4 16PLY ~~LOC~~+RFID DBL (SPONGE) ×6 IMPLANT
GLOVE SURG ENC MOIS LTX SZ6.5 (GLOVE) ×3 IMPLANT
GLOVE SURG UNDER POLY LF SZ7 (GLOVE) ×6 IMPLANT
GOWN STRL REUS W/TWL LRG LVL3 (GOWN DISPOSABLE) ×6 IMPLANT
IV NS IRRIG 3000ML ARTHROMATIC (IV SOLUTION) ×3 IMPLANT
KIT PROCEDURE FLUENT (KITS) ×3 IMPLANT
KIT TURNOVER CYSTO (KITS) ×3 IMPLANT
MYOSURE XL FIBROID (MISCELLANEOUS)
PACK VAGINAL MINOR WOMEN LF (CUSTOM PROCEDURE TRAY) ×3 IMPLANT
PAD DRESSING TELFA 3X8 NADH (GAUZE/BANDAGES/DRESSINGS) ×2 IMPLANT
PAD OB MATERNITY 4.3X12.25 (PERSONAL CARE ITEMS) ×3 IMPLANT
PAD PREP 24X48 CUFFED NSTRL (MISCELLANEOUS) ×3 IMPLANT
SEAL CERVICAL OMNI LOK (ABLATOR) IMPLANT
SEAL ROD LENS SCOPE MYOSURE (ABLATOR) ×3 IMPLANT
SYSTEM TISS REMOVAL MYOSURE XL (MISCELLANEOUS) IMPLANT

## 2021-08-30 NOTE — Op Note (Addendum)
Operative Note ? ?08/30/2021 ? ?11:31 AM ? ?PATIENT:  Dominique Mendoza  51 y.o. female ? ?PRE-OPERATIVE DIAGNOSIS:  Abnormal uterine bleeding, thickened endometrium, fibroids ? ?POST-OPERATIVE DIAGNOSIS:  Abnormal uterine bleeding, thickened endometrium, fibroids ? ?PROCEDURE:  Procedure(s): ?DILATATION & CURETTAGE/HYSTEROSCOPY WITH MYOSURE EXCISION ? ?SURGEON:  Surgeon(s): ?[name], MD ? ?ANESTHESIA:   general ? ?FINDINGS: Scar tissue at the right fundal area, small endometrial polyp.  No submucosal fibroid. ? ?DESCRIPTION OF OPERATION: Under general anesthesia with laryngeal mask, the patient is in lithotomy position.  She is prepped with Betadine on the suprapubic, vulvar and vaginal areas.  The bladder is catheterized.  The patient is draped as usual.  Timeout is done.  The vaginal exam reveals a mobile anteverted uterus mildly enlarged and nodular.  No adnexal mass.  The speculum is inserted in the vagina and the anterior lip of the cervix is grasped with a tenaculum.  A paracervical block is done with lidocaine 1% a total of 20 cc at 4 and 8:00.  Dilation of the cervix with Pratt dilators up to #19 without difficulty.  The hysteroscope is inserted in the intra uterine cavity.  Inspection reveals 2 normal ostia, mild scar tissue at the right fundal area, 3 small areas of thickening compatible with polyps.  No submucosal fibroid is present.  Pictures were taken.  The reach MyoSure was inserted.  Excisions at the areas of thickenings/polyps, partial excision of the scar tissue at the right fundal area.  Pictures were taken after excisions.  Hemostasis was adequate at all levels.  The hysteroscope with MyoSure were removed.  A systematic curettage of the intra uterine cavity on all surfaces was done with a sharp curette.  The curette was removed.  The 2 specimens were sent together to pathology.  The tenaculum was removed from the cervix.  Silver nitrate was used at that level to control hemostasis.  The  speculum was removed.  The patient was brought to recovery room in good and stable status. ? ?ESTIMATED BLOOD LOSS: 5 mL ?Fluid Deficit: 450 mL ? ?Intake/Output Summary (Last 24 hours) at 08/30/2021 1131 ?Last data filed at 08/30/2021 1125 ?Gross per 24 hour  ?Intake 400 ml  ?Output 5 ml  ?Net 395 ml  ?  ? ?BLOOD ADMINISTERED:none  ? ?LOCAL MEDICATIONS USED:  LIDOCAINE 1% 20 CC Paracervical Block ? ?SPECIMEN:  Source of Specimen:  Excision material, possible Polyp and Endometrial curettings ? ?DISPOSITION OF SPECIMEN:  PATHOLOGY ? ?COUNTS:  YES ? ?PLAN OF CARE: Transfer to PACU ? ?Marie-Lyne LavoieMD11:31 AM ? ? ?   ?

## 2021-08-30 NOTE — Transfer of Care (Signed)
Immediate Anesthesia Transfer of Care Note ? ?Patient: Dominique Mendoza ? ?Procedure(s) Performed: [place] ? ?Patient Location: PACU ? ?Anesthesia Type:General ? ?Level of Consciousness: sedated ? ?Airway & Oxygen Therapy: Patient Spontanous Breathing ? ?Post-op Assessment: Report given to RN and Post -op Vital signs reviewed and stable ? ?Post vital signs: Reviewed and stable ? ?Last Vitals:  ?Vitals Value Taken Time  ?BP 137/77 08/30/21 1135  ?Temp    ?Pulse 75 08/30/21 1136  ?Resp 15 08/30/21 1136  ?SpO2 100 % 08/30/21 1136  ?Vitals shown include unvalidated device data. ? ?Last Pain:  ?Vitals:  ? 08/30/21 0937  ?TempSrc: Oral  ?PainSc: 0-No pain  ?   ? ?Patients Stated Pain Goal: 4 (08/30/21 2072) ? ?Complications: No notable events documented. ?

## 2021-08-30 NOTE — H&P (Signed)
?Dominique Mendoza is an 51 y.o. female.G2P2L3 Married ?  ?RP: HSC/D+C/Myosure Excision on 08/30/2021 ?  ?HPI: Continued variable amount of vaginal bleeding x 06/10/2021, even on Megace 40 mg BID since the end of January 2023.  S/P Endometrial Ablation.  Pelvic US 06/21/2021 showed an endometrial line at 14.9 mm with a probable IU lesion.  SonoHysto confirmed a 1.5 cm lesion c/w a SM Fibroid.   ?  ?Pertinent Gynecological History: ?Menses:  Menometrorrhagia ?Contraception: tubal ligation ?Blood transfusions: none ?Previous GYN Procedures:  Tubal Ligation and Endometrial Ablation   ?Last mammogram: normal  ?Last pap: normal  ?  ? ?Menstrual History: ? ?Patient's last menstrual period was 06/10/2021 (exact date). ?  ? ?Past Medical History:  ?Diagnosis Date  ? Anxiety   ? COVID-19 2021  ? Depression   ? GERD (gastroesophageal reflux disease)   ? Hypothyroid   ? Menorrhagia   ? Perforating folliculitis   ? Wears glasses   ? ? ?Past Surgical History:  ?Procedure Laterality Date  ? BREAST BIOPSY Right 03/28/2011  ? CESAREAN SECTION  2004  ? ENDOMETRIAL ABLATION  08/2012  ? HER OPTION  ? TUBAL LIGATION  2004  ? ? ?Family History  ?Problem Relation Age of Onset  ? Cancer Mother   ?     LUNG  ? Diabetes Father   ? Cancer Maternal Grandmother   ?     LYMPHOMA  ? ? ?Social History:  reports that she has never smoked. She has never used smokeless tobacco. She reports current alcohol use. She reports that she does not use drugs. ? ?Allergies:  ?Allergies  ?Allergen Reactions  ? Codeine Nausea And Vomiting  ? Sulfa Antibiotics Nausea And Vomiting  ? ? ?Medications Prior to Admission  ?Medication Sig Dispense Refill Last Dose  ? BIOTIN PO Take by mouth daily.   08/29/2021  ? Bisacodyl (LAXATIVE PO) Take by mouth. Once a week   Past Week  ? FIBER PO Take by mouth.   08/29/2021  ? megestrol (MEGACE) 40 MG tablet Take one tab po bid. 60 tablet 1 Past Week  ? montelukast (SINGULAIR) 10 MG tablet Take 10 mg by mouth daily.   08/30/2021  ?  omeprazole (PRILOSEC) 40 MG capsule Take 40 mg by mouth daily.   08/30/2021  ? SYNTHROID 137 MCG tablet Take 137 mcg by mouth daily.   08/30/2021  ? venlafaxine (EFFEXOR) 75 MG tablet Take 75 mg by mouth 2 (two) times daily.     08/30/2021  ? acyclovir (ZOVIRAX) 800 MG tablet Take by mouth as needed.   More than a month  ? ? ?REVIEW OF SYSTEMS: A ROS was performed and pertinent positives and negatives are included in the history. ? GENERAL: No fevers or chills. HEENT: No change in vision, no earache, sore throat or sinus congestion. NECK: No pain or stiffness. CARDIOVASCULAR: No chest pain or pressure. No palpitations. PULMONARY: No shortness of breath, cough or wheeze. GASTROINTESTINAL: No abdominal pain, nausea, vomiting or diarrhea, melena or bright red blood per rectum. GENITOURINARY: No urinary frequency, urgency, hesitancy or dysuria. MUSCULOSKELETAL: No joint or muscle pain, no back pain, no recent trauma. DERMATOLOGIC: No rash, no itching, no lesions. ENDOCRINE: No polyuria, polydipsia, no heat or cold intolerance. No recent change in weight. HEMATOLOGICAL: No anemia or easy bruising or bleeding. NEUROLOGIC: No headache, seizures, numbness, tingling or weakness. PSYCHIATRIC: No depression, no loss of interest in normal activity or change in sleep pattern.  ? ? ? ?Blood  pressure (!) 122/49, pulse 70, temperature 98.2 ?F (36.8 ?C), temperature source Oral, resp. rate 15, height '5\' 2"'$  (1.575 m), weight 65.3 kg, last menstrual period 06/10/2021, SpO2 100 %. ? ?Physical Exam: ? ? ?Results for orders placed or performed during the hospital encounter of 08/30/21 (from the past 24 hour(s))  ?Pregnancy, urine POC     Status: None  ? Collection Time: 08/30/21  9:19 AM  ?Result Value Ref Range  ? Preg Test, Ur NEGATIVE NEGATIVE  ?Sono Infusion Hysterogram ( procedure note) ?  ?  ?The initial transvaginal ultrasound demonstrated the following: ?  ?Anteverted uterus.  Anterior Subserosal fibroid measured at 5.1 x 4.5 cm. ?  ?   ?The speculum  was inserted and the cervix cleansed with Betadine solution after confirming that patient has no allergies.A small sonohysterography catheterwas utilized.  Insertion was facilitated with ring forceps, using a spear-like motion the catheter was inserted to the fundus of the uterus. The speculum is then removed carefully to avoid dislodging the catheter. ?The catheter was flushed with sterile saline delete prior to insertion to rid it of small amounts of air.the sterile saline solution was infused into the uterine cavity as a vaginal ultrasound probe was then placed in the vagina for full visualization of the uterine cavity from a transvaginal approach. The following was noted: ?  ?Injection of saline.  Intracavitary mass measured at 1.5 x 1.3 cm c/w a Submucosal Fibroid. ?  ?The catheter was then removed after retrieving some of the saline from the intrauterine cavity. An endometrial biopsy not done. Patient tolerated procedure well. She had received a tablet of Aleve for discomfort.   ?  ?  ?Assessment/Plan:  51 y.o. [id number]  ?  ?1. Menorrhagia with irregular cycle ?Continued variable amount of vaginal bleeding x 06/10/2021, even on Megace 40 mg BID since the end of January 2023.  S/P Endometrial Ablation.  Pelvic US 06/21/2021 showed an endometrial line at 14.9 mm with a probable IU lesion.  Sonohysterogram confirming an intracavitary lesion measured at 1.5 x 1.3 cm c/w a Submucosal Fibroid. [place] Myosure Excision and D+C scheduled 08/30/2021.  Preop preparation, surgery and risks thoroughly reviewed including uterine perforation, postop precautions and expectations reviewed. ?  ?2. Thickened endometrium ?D/t SM Fibroid. ?  ?3. Fibroids, submucosal  ?1.5 x 1.3 cm ?  ?                      Patient was counseled as to the risk of surgery to include the following: ?  ?1. Infection (prohylactic antibiotics will be administered) ?  ?2. DVT/Pulmonary Embolism (prophylactic pneumo compression stockings will be  used) ?  ?3.Trauma to internal organs requiring additional surgical procedure to repair any injury to internal organs requiring perhaps additional hospitalization days. ?  ?4.Hemmorhage requiring transfusion and blood products which carry risks such as  anaphylactic reaction, hepatitis and AIDS ?  ?Patient had received literature information on the procedure scheduled and all her questions were answered and fully accepts all risk. ?  ? ?Marie-Lyne Jeric Slagel ?08/30/2021, 10:45 AM ?  ?

## 2021-08-30 NOTE — Anesthesia Postprocedure Evaluation (Signed)
Anesthesia Post Note ? ?Patient: Dominique Mendoza ? ?Procedure(s) Performed: [place] ? ?  ? ?Patient location during evaluation: PACU ?Anesthesia Type: General ?Level of consciousness: awake and alert ?Pain management: pain level controlled ?Vital Signs Assessment: post-procedure vital signs reviewed and stable ?Respiratory status: spontaneous breathing, nonlabored ventilation, respiratory function stable and patient connected to nasal cannula oxygen ?Cardiovascular status: blood pressure returned to baseline and stable ?Postop Assessment: no apparent nausea or vomiting ?Anesthetic complications: no ? ? ?No notable events documented. ? ?Last Vitals:  ?Vitals:  ? 08/30/21 1215 08/30/21 1238  ?BP: 137/65 124/68  ?Pulse: 70 70  ?Resp: 15 16  ?Temp: (!) 36.3 ?C 36.4 ?C  ?SpO2: 100% 100%  ?  ?Last Pain:  ?Vitals:  ? 08/30/21 1238  ?TempSrc:   ?PainSc: 0-No pain  ? ? ?  ?  ?  ?  ?  ?  ? ?Suzette Battiest E ? ? ? ? ?

## 2021-08-30 NOTE — Anesthesia Procedure Notes (Signed)
Procedure Name: LMA Insertion ?Date/Time: 08/30/2021 11:00 AM ?Performed by: Georgeanne Nim, CRNA ?Pre-anesthesia Checklist: Patient identified, Patient being monitored, Emergency Drugs available, Timeout performed and Suction available ?Patient Re-evaluated:Patient Re-evaluated prior to induction ?Oxygen Delivery Method: Circle System Utilized ?Preoxygenation: Pre-oxygenation with 100% oxygen ?Induction Type: IV induction ?Ventilation: Mask ventilation without difficulty ?LMA: LMA inserted ?LMA Size: 4.0 ?Number of attempts: 1 ?Placement Confirmation: positive ETCO2 and breath sounds checked- equal and bilateral ? ? ? ? ?

## 2021-08-30 NOTE — Discharge Instructions (Addendum)
DISCHARGE INSTRUCTIONS: HYSTEROSCOPY  ?The following instructions have been prepared to help you care for yourself upon your return home. ? ? ? ?May take Ibuprofen after  5:20pm today. ? ?May take tylenol after 3:30 pm today. ? ? ? ?Personal hygiene: ? Use sanitary pads for vaginal drainage, not tampons. ? Shower the day after your procedure. ? NO tub baths, pools or Jacuzzis for 2-3 weeks. ? Wipe front to back after using the bathroom. ? ?Activity and limitations: ? Do NOT drive or operate any equipment for 24 hours. The effects of anesthesia are still present ?and drowsiness may result. ? Do NOT rest in bed all day. ? Walking is encouraged. ? Walk up and down stairs slowly. ? You may resume your normal activity in one to two days or as indicated by your physician. ?Sexual activity: NO intercourse for at least 2 weeks after the procedure, or as indicated by your ?Doctor. ? ?Diet: Eat a light meal as desired this evening. You may resume your usual diet tomorrow. ? ?Return to Work: You may resume your work activities in one to two days or as indicated by your ?Doctor. ? ?What to expect after your surgery: Expect to have vaginal bleeding/discharge for 2-3 days and ?spotting for up to 10 days. It is not unusual to have soreness for up to 1-2 weeks. You may have a ?slight burning sensation when you urinate for the first day. Mild cramps may continue for a couple of ?days. You may have a regular period in 2-6 weeks. ? ?Call your doctor for any of the following: ? Excessive vaginal bleeding or clotting, saturating and changing one pad every hour. ? Inability to urinate 6 hours after discharge from hospital. ? Pain not relieved by pain medication. ? Fever of 100.4? F or greater. ? Unusual vaginal discharge or odor. ? ?Return to office _________________Call for an appointment ___________________ ?Patient?s signature: ______________________ ?Nurse?s signature ________________________ ? ?Post Anesthesia Care Unit  ? ? ? ?Post  Anesthesia Home Care Instructions ? ?Activity: ?Get plenty of rest for the remainder of the day. A responsible individual must stay with you for 24 hours following the procedure.  ?For the next 24 hours, DO NOT: ?-Drive a car ?-Paediatric nurse ?-Drink alcoholic beverages ?-Take any medication unless instructed by your physician ?-Make any legal decisions or sign important papers. ? ?Meals: ?Start with liquid foods such as gelatin or soup. Progress to regular foods as tolerated. Avoid greasy, spicy, heavy foods. If nausea and/or vomiting occur, drink only clear liquids until the nausea and/or vomiting subsides. Call your physician if vomiting continues. ? ?Special Instructions/Symptoms: ?Your throat may feel dry or sore from the anesthesia or the breathing tube placed in your throat during surgery. If this causes discomfort, gargle with warm salt water. The discomfort should disappear within 24 hours. ? ?If you had a scopolamine patch placed behind your ear for the management of post- operative nausea and/or vomiting: ? ?1. The medication in the patch is effective for 72 hours, after which it should be removed.  Wrap patch in a tissue and discard in the trash. Wash hands thoroughly with soap and water. ?2. You may remove the patch earlier than 72 hours if you experience unpleasant side effects which may include dry mouth, dizziness or visual disturbances. ?3. Avoid touching the patch. Wash your hands with soap and water after contact with the patch. ?    ?

## 2021-08-30 NOTE — Anesthesia Preprocedure Evaluation (Signed)
Anesthesia Evaluation  ?Patient identified by MRN, date of birth, ID band ?Patient awake ? ? ? ?Reviewed: ?Allergy & Precautions, NPO status , Patient's Chart, lab work & pertinent test results ? ?Airway ?Mallampati: II ? ?TM Distance: >3 FB ?Neck ROM: Full ? ? ? Dental ? ?(+) Dental Advisory Given ?  ?Pulmonary ?neg pulmonary ROS,  ?  ?breath sounds clear to auscultation ? ? ? ? ? ? Cardiovascular ?negative cardio ROS ? ? ?Rhythm:Regular Rate:Normal ? ? ?  ?Neuro/Psych ?negative neurological ROS ?   ? GI/Hepatic ?Neg liver ROS, GERD  ,  ?Endo/Other  ?Hypothyroidism  ? Renal/GU ?negative Renal ROS  ? ?  ?Musculoskeletal ? ? Abdominal ?  ?Peds ? Hematology ?negative hematology ROS ?(+)   ?Anesthesia Other Findings ? ? Reproductive/Obstetrics ? ?  ? ? ? ? ? ? ? ? ? ? ? ? ? ?  ?  ? ? ? ? ? ? ? ? ?Anesthesia Physical ?Anesthesia Plan ? ?ASA: 2 ? ?Anesthesia Plan: General  ? ?Post-op Pain Management: Tylenol PO (pre-op)* and Toradol IV (intra-op)*  ? ?Induction: Intravenous ? ?PONV Risk Score and Plan: 4 or greater and Scopolamine patch - Pre-op, Midazolam, Dexamethasone, Ondansetron and Treatment may vary due to age or medical condition ? ?Airway Management Planned: LMA ? ?Additional Equipment: None ? ?Intra-op Plan:  ? ?Post-operative Plan: Extubation in OR ? ?Informed Consent: I have reviewed the patients History and Physical, chart, labs and discussed the procedure including the risks, benefits and alternatives for the proposed anesthesia with the patient or authorized representative who has indicated his/her understanding and acceptance.  ? ? ? ?Dental advisory given ? ?Plan Discussed with: CRNA ? ?Anesthesia Plan Comments:   ? ? ? ? ? ? ?Anesthesia Quick Evaluation ? ?

## 2021-08-30 NOTE — Addendum Note (Signed)
Addendum  created 08/30/21 1522 by Georgeanne Nim, CRNA  ? Attestation recorded in [employer] section accepted, Flowsheet accepted, Wellsite geologist, Agricultural consultant edited  ?  ?

## 2021-08-31 ENCOUNTER — Encounter (HOSPITAL_BASED_OUTPATIENT_CLINIC_OR_DEPARTMENT_OTHER): Payer: Self-pay | Admitting: Obstetrics & Gynecology

## 2021-08-31 LAB — SURGICAL PATHOLOGY

## 2021-09-09 ENCOUNTER — Encounter: Payer: Self-pay | Admitting: Obstetrics & Gynecology

## 2021-09-09 ENCOUNTER — Ambulatory Visit (INDEPENDENT_AMBULATORY_CARE_PROVIDER_SITE_OTHER): Payer: PRIVATE HEALTH INSURANCE | Admitting: Obstetrics & Gynecology

## 2021-09-09 VITALS — BP 110/78

## 2021-09-09 DIAGNOSIS — Z09 Encounter for follow-up examination after completed treatment for conditions other than malignant neoplasm: Secondary | ICD-10-CM | POA: Diagnosis not present

## 2021-09-09 DIAGNOSIS — N914 Secondary oligomenorrhea: Secondary | ICD-10-CM

## 2021-09-09 MED ORDER — NORETHINDRONE 0.35 MG PO TABS
1.0000 | ORAL_TABLET | Freq: Every day | ORAL | 4 refills | Status: DC
Start: 2021-09-09 — End: 2021-11-07

## 2021-09-09 NOTE — Progress Notes (Signed)
? ? ?  Dominique Mendoza [birth year] [id number] ? ? ?     51 y.o.  G2P1A1L3  ? ?RP: Postop HSC/Myosure Excision/D+C on 08/30/2021 ? ?HPI: Good postop healing.  Continued mild vaginal bleeding postop, so restarted Megace 40 mg PO BID, which stopped her bleeding.  No abnormal d/c. No pelvic pain. No fever. ? ? ?OB History  ?Gravida Para Term Preterm AB Living  ?'2 2 1 1 '$ 0 3  ?SAB IAB Ectopic Multiple Live Births  ?0   0 1 3  ?  ?# Outcome Date GA Lbr Len/2nd Weight Sex Delivery Anes PTL Lv  ?2A Preterm     M CS-Unspec  Y LIV  ?2B Preterm     M CS-Unspec  Y LIV  ?1 Term     M Vag-Spont  N LIV  ? ? ?Past medical history,surgical history, problem list, medications, allergies, family history and social history were all reviewed and documented in the EPIC chart. ? ? ?Directed ROS with pertinent positives and negatives documented in the history of present illness/assessment and plan. ? ?Exam: ? ?Vitals:  ? 09/09/21 0818  ?BP: 110/78  ? ?General appearance:  Normal ? ?Abdomen: Normal ? ?Gynecologic exam: Vulva normal. Bimanual exam:  Uterus AV, normal volume, mobile, NT.  No adnexal mass, NT.  Normal secretions, no blood. ? ?Patho: FINAL MICROSCOPIC DIAGNOSIS:  ? ?A. ENDOMETRIUM, CURETTAGE:  ?Benign inactive endometrium and marked progestational effect  ?Stromal breakdown consistent with bleeding  ?Negative for polyp, hyperplasia and carcinoma  ? ? ?Assessment/Plan:  51 y.o. [id number]  ? ?1. Status post gynecological surgery, follow-up exam ?Good postop healing.  Continued mild vaginal bleeding postop, so restarted Megace 40 mg PO BID, which stopped her bleeding.  No abnormal d/c. No pelvic pain. No fever. Patho benign, normal gyn exam.  F/U Annual Gyn visit in 10/2021. ? ?2. Secondary oligomenorrhea/Perimenopause ?Probably perimenopausal.  Benign Patho.  Decision to assure a Progestin protection of the Endometrium with the Progestin only BCPs.  No CI.  Usage reviewed.  Prescription sent to pharmacy. ? ?Other orders ?- norethindrone  (MICRONOR) 0.35 MG tablet; Take 1 tablet (0.35 mg total) by mouth daily.  ? ?[name] MD, 9:11 AM 09/09/2021 ? ? ? ?  ?

## 2021-09-12 ENCOUNTER — Encounter: Payer: PRIVATE HEALTH INSURANCE | Admitting: Obstetrics & Gynecology

## 2021-09-22 ENCOUNTER — Telehealth: Payer: Self-pay | Admitting: *Deleted

## 2021-09-22 ENCOUNTER — Telehealth: Payer: Self-pay

## 2021-09-22 DIAGNOSIS — N939 Abnormal uterine and vaginal bleeding, unspecified: Secondary | ICD-10-CM

## 2021-09-22 MED ORDER — MEGESTROL ACETATE 40 MG PO TABS: 40.0000 mg | ORAL_TABLET | Freq: Two times a day (BID) | ORAL | 1 refills | Status: DC

## 2021-09-22 NOTE — Telephone Encounter (Signed)
Dominique Mendoza already had open encounter and spoke with patient sent to provider. Encounter closed.  ?

## 2021-09-22 NOTE — Telephone Encounter (Signed)
Pt calling to report starting POPs on 09/11/21. Started seeing some spotting last week, stated she took 2-3 tabs of megace about q other day. Said she was fine over the weekend but started back with spotting/lighter flow this week. Has taken 3 tabs of megace one a day and the bleeding still continues. Also states the megace is almost out. Denies any other sxs. Please advise.  ?

## 2021-09-22 NOTE — Telephone Encounter (Signed)
Patient called left message in triage voicemail c/o bleeding while taking norethindrone 0.35 mg tablet and megace. Left message for patient to call. ?

## 2021-09-22 NOTE — Telephone Encounter (Signed)
Per ML: ? ?"Please send a prescription on Megace 40 mg PO BID until her Gyn health visit in June 2023.  Recommend taking the Megace BID every day until then."  ? ?Rx sent. ? ?Pt additionally would like to know if you wish for her to continue on the micronor? Please advise.  ?

## 2021-09-26 NOTE — Telephone Encounter (Signed)
Per ML ? ?"Stop micronor." ? ?Pt notified and voiced understanding.  ?

## 2021-11-07 ENCOUNTER — Encounter: Payer: Self-pay | Admitting: Obstetrics & Gynecology

## 2021-11-07 ENCOUNTER — Other Ambulatory Visit (HOSPITAL_COMMUNITY)
Admission: RE | Admit: 2021-11-07 | Discharge: 2021-11-07 | Disposition: A | Discharge status: Home / Self Care | Payer: PRIVATE HEALTH INSURANCE | Source: Ambulatory Visit | Attending: Obstetrics & Gynecology | Admitting: Obstetrics & Gynecology

## 2021-11-07 ENCOUNTER — Ambulatory Visit (INDEPENDENT_AMBULATORY_CARE_PROVIDER_SITE_OTHER): Payer: PRIVATE HEALTH INSURANCE | Admitting: Obstetrics & Gynecology

## 2021-11-07 VITALS — BP 122/70 | HR 70 | Resp 12 | Ht 62.5 in | Wt 145.0 lb

## 2021-11-07 DIAGNOSIS — Z9851 Tubal ligation status: Secondary | ICD-10-CM | POA: Diagnosis not present

## 2021-11-07 DIAGNOSIS — Z01419 Encounter for gynecological examination (general) (routine) without abnormal findings: Secondary | ICD-10-CM | POA: Diagnosis present

## 2021-11-07 DIAGNOSIS — D219 Benign neoplasm of connective and other soft tissue, unspecified: Secondary | ICD-10-CM

## 2021-11-07 DIAGNOSIS — N921 Excessive and frequent menstruation with irregular cycle: Secondary | ICD-10-CM | POA: Diagnosis not present

## 2021-11-07 NOTE — Progress Notes (Signed)
Dominique Mendoza [birth year] [id number]   History:    51 y.o. G2P2L3 Married.  S/P TL.  Twin just graduated HS.  RP:  Established patient presenting for annual gyn exam   HPI: Menometrorrhagia currently controled on Megace 40 mg daily.  HSC/Myosure/D+C on 08/30/2021.  Patho Benign.  No pelvic pain.  No pain with IC.  Pap Neg 10/2018.  No h/o abnormal Pap.  Pap reflex today.  Breasts normal.  Screening mammo Neg 01/2021.  Colono 2019.  BMI 26.1.  Good fitness.  Health Labs with [facility] NP.     Past medical history,surgical history, family history and social history were all reviewed and documented in the EPIC chart.  Gynecologic History No LMP recorded. (Menstrual status: Irregular Periods).  Obstetric History OB History  Gravida Para Term Preterm AB Living  '2 2 1 1 '$ 0 3  SAB IAB Ectopic Multiple Live Births  0   0 1 3    # Outcome Date GA Lbr Len/2nd Weight Sex Delivery Anes PTL Lv  2A Preterm     M CS-Unspec  Y LIV  2B Preterm     M CS-Unspec  Y LIV  1 Term     M Vag-Spont  N LIV     ROS: A ROS was performed and pertinent positives and negatives are included in the history.  GENERAL: No fevers or chills. HEENT: No change in vision, no earache, sore throat or sinus congestion. NECK: No pain or stiffness. CARDIOVASCULAR: No chest pain or pressure. No palpitations. PULMONARY: No shortness of breath, cough or wheeze. GASTROINTESTINAL: No abdominal pain, nausea, vomiting or diarrhea, melena or bright red blood per rectum. GENITOURINARY: No urinary frequency, urgency, hesitancy or dysuria. MUSCULOSKELETAL: No joint or muscle pain, no back pain, no recent trauma. DERMATOLOGIC: No rash, no itching, no lesions. ENDOCRINE: No polyuria, polydipsia, no heat or cold intolerance. No recent change in weight. HEMATOLOGICAL: No anemia or easy bruising or bleeding. NEUROLOGIC: No headache, seizures, numbness, tingling or weakness. PSYCHIATRIC: No depression, no loss of interest in normal activity or change in  sleep pattern.     Exam:   BP 122/70 (BP Location: Left Arm, Patient Position: Sitting, Cuff Size: Normal)   Pulse 70   Resp 12   Ht 5' 2.5" (1.588 m)   Wt 145 lb (65.8 kg)   BMI 26.10 kg/m   Body mass index is 26.1 kg/m.  General appearance : Well developed well nourished female. No acute distress HEENT: Eyes: no retinal hemorrhage or exudates,  Neck supple, trachea midline, no carotid bruits, no thyroidmegaly Lungs: Clear to auscultation, no rhonchi or wheezes, or rib retractions  Heart: Regular rate and rhythm, no murmurs or gallops Breast:Examined in sitting and supine position were symmetrical in appearance, no palpable masses or tenderness,  no skin retraction, no nipple inversion, no nipple discharge, no skin discoloration, no axillary or supraclavicular lymphadenopathy Abdomen: no palpable masses or tenderness, no rebound or guarding Extremities: no edema or skin discoloration or tenderness  Pelvic: Vulva: Normal             Vagina: No gross lesions or discharge  Cervix: No gross lesions or discharge.  Pap reflex done.  Uterus  AV, slightly nodular about 9 cm, non-tender and mobile  Adnexa  Without masses or tenderness  Anus: Normal   Assessment/Plan:  51 y.o. female for annual exam   1. Encounter for routine gynecological examination with Papanicolaou smear of cervix Menometrorrhagia currently controled on Megace 40 mg  daily.  HSC/Myosure/D+C on 08/30/2021.  Patho Benign.  No pelvic pain.  No pain with IC.  Pap Neg 10/2018.  No h/o abnormal Pap.  Pap reflex today.  Breasts normal.  Screening mammo Neg 01/2021.  Colono 2019.  BMI 26.1.  Good fitness.  Health Labs with Fam NP.   - Cytology - PAP( [facility])  2. S/P tubal ligation  3. Menorrhagia with irregular cycle Menometrorrhagia currently controled on Megace 40 mg daily.  HSC/Myosure/D+C on 08/30/2021.  Patho Benign.  No pelvic pain.  No pain with IC.  Ready to stop Megace.  Will observe menstrual pattern off Megace.   May be in Perimenopause.  Bleeding precautions reviewed.  4. Fibroids  No SM Fibroid.  Will Observe without hormonal treatment.  Counseling on Menometrorrhagia/Fibroids for 10 minutes.  [name] MD, 10:46 AM 11/07/2021

## 2021-11-08 LAB — CYTOLOGY - PAP: Diagnosis: NEGATIVE

## 2022-01-16 ENCOUNTER — Other Ambulatory Visit: Payer: Self-pay | Admitting: Obstetrics & Gynecology

## 2022-01-16 DIAGNOSIS — Z1231 Encounter for screening mammogram for malignant neoplasm of breast: Secondary | ICD-10-CM

## 2022-02-16 ENCOUNTER — Ambulatory Visit
Admission: RE | Admit: 2022-02-16 | Discharge: 2022-02-16 | Disposition: A | Discharge status: Home / Self Care | Payer: PRIVATE HEALTH INSURANCE | Source: Ambulatory Visit | Attending: Obstetrics & Gynecology | Admitting: Obstetrics & Gynecology

## 2022-02-16 DIAGNOSIS — Z1231 Encounter for screening mammogram for malignant neoplasm of breast: Secondary | ICD-10-CM

## 2022-11-09 ENCOUNTER — Ambulatory Visit: Payer: PRIVATE HEALTH INSURANCE | Admitting: Obstetrics & Gynecology

## 2022-11-09 ENCOUNTER — Encounter: Payer: Self-pay | Admitting: Obstetrics & Gynecology

## 2022-11-09 VITALS — BP 118/80 | HR 77 | Ht 62.5 in | Wt 142.0 lb

## 2022-11-09 DIAGNOSIS — Z780 Asymptomatic menopausal state: Secondary | ICD-10-CM | POA: Diagnosis not present

## 2022-11-09 DIAGNOSIS — Z9851 Tubal ligation status: Secondary | ICD-10-CM | POA: Diagnosis not present

## 2022-11-09 DIAGNOSIS — Z01419 Encounter for gynecological examination (general) (routine) without abnormal findings: Secondary | ICD-10-CM

## 2022-11-09 NOTE — Progress Notes (Signed)
Dominique Mendoza [birth year] [id number]   History:    52 y.o. G2P2L3 Married.  S/P TL.  Twin in [place].   RP:  Established patient presenting for annual gyn exam    HPI: HSC/Myosure/D+C on 08/30/2021.  Patho Benign. Menopause, well on no HRT. LMP 10/2021.  No PMB. No pelvic pain.  No pain with IC.  Pap Neg 10/2021.  No h/o abnormal Pap.  Repeat Pap at 3 years .  Breasts normal.  Screening mammo Neg 01/2022.  Col[place] 2019.  BMI 25.56.  Good fitness.  Health Labs with [facility] NP.    Past medical history,surgical history, family history and social history were all reviewed and documented in the EPIC chart.  Gynecologic History No LMP recorded. Patient is perimenopausal.  Obstetric History OB History  Gravida Para Term Preterm AB Living  2 2 1 1  0 3  SAB IAB Ectopic Multiple Live Births  0   0 1 3    # Outcome Date GA Lbr Len/2nd Weight Sex Delivery Anes PTL Lv  2A Preterm     M CS-Unspec  Y LIV  2B Preterm     M CS-Unspec  Y LIV  1 Term     M Vag-Spont  N LIV     ROS: A ROS was performed and pertinent positives and negatives are included in the history. GENERAL: No fevers or chills. HEENT: No change in vision, no earache, sore throat or sinus congestion. NECK: No pain or stiffness. CARDIOVASCULAR: No chest pain or pressure. No palpitations. PULMONARY: No shortness of breath, cough or wheeze. GASTROINTESTINAL: No abdominal pain, nausea, vomiting or diarrhea, melena or bright red blood per rectum. GENITOURINARY: No urinary frequency, urgency, hesitancy or dysuria. MUSCULOSKELETAL: No joint or muscle pain, no back pain, no recent trauma. DERMATOLOGIC: No rash, no itching, no lesions. ENDOCRINE: No polyuria, polydipsia, no heat or cold intolerance. No recent change in weight. HEMATOLOGICAL: No anemia or easy bruising or bleeding. NEUROLOGIC: No headache, seizures, numbness, tingling or weakness. PSYCHIATRIC: No depression, no loss of interest in normal activity or change in sleep pattern.      Exam:   BP 118/80   Pulse 77   Ht 5' 2.5" (1.588 m)   Wt 142 lb (64.4 kg)   SpO2 99%   BMI 25.56 kg/m   Body mass index is 25.56 kg/m.  General appearance : Well developed well nourished female. No acute distress HEENT: Eyes: no retinal hemorrhage or exudates,  Neck supple, trachea midline, no carotid bruits, no thyroidmegaly Lungs: Clear to auscultation, no rhonchi or wheezes, or rib retractions  Heart: Regular rate and rhythm, no murmurs or gallops Breast:Examined in sitting and supine position were symmetrical in appearance, no palpable masses or tenderness,  no skin retraction, no nipple inversion, no nipple discharge, no skin discoloration, no axillary or supraclavicular lymphadenopathy Abdomen: no palpable masses or tenderness, no rebound or guarding Extremities: no edema or skin discoloration or tenderness  Pelvic: Vulva: Normal             Vagina: No gross lesions or discharge  Cervix: No gross lesions or discharge  Uterus  AV, normal size, shape and consistency, non-tender and mobile  Adnexa  Without masses or tenderness  Anus: Normal   Assessment/Plan:  52 y.o. female for annual exam   1. Well female exam with routine gynecological exam HSC/Myosure/D+C on 08/30/2021.  Patho Benign. Menopause, well on no HRT. LMP 10/2021.  No PMB. No pelvic pain.  No pain with IC.  Pap Neg 10/2021.  No h/o abnormal Pap.  Repeat Pap at 3 years .  Breasts normal.  Screening mammo Neg 01/2022.  Colono 2019.  BMI 25.56.  Good fitness.  Health Labs with Fam NP.    2. S/P tubal ligation  3. Postmenopause HSC/Myosure/D+C on 08/30/2021.  Patho Benign. Menopause, well on no HRT. LMP 10/2021.  No PMB. No pelvic pain.  No pain with IC.    Other orders - fluticasone (FLONASE) 50 MCG/ACT nasal spray; Place 1 spray into both nostrils daily. - meloxicam (MOBIC) 15 MG tablet; Take 15 mg by mouth daily. - triamcinolone cream (KENALOG) 0.1 %; SMARTSIG:1 Application Topical 2-3 Times Daily -  levothyroxine (SYNTHROID) 125 MCG tablet; Take 125 mcg by mouth daily.   Genia Del MD, 2:39 PM

## 2023-01-08 ENCOUNTER — Other Ambulatory Visit: Payer: Self-pay | Admitting: Radiology

## 2023-01-08 DIAGNOSIS — Z1231 Encounter for screening mammogram for malignant neoplasm of breast: Secondary | ICD-10-CM

## 2023-02-20 ENCOUNTER — Ambulatory Visit
Admission: RE | Admit: 2023-02-20 | Discharge: 2023-02-20 | Disposition: A | Discharge status: Home / Self Care | Payer: PRIVATE HEALTH INSURANCE | Source: Ambulatory Visit | Attending: Radiology | Admitting: Radiology

## 2023-02-20 DIAGNOSIS — Z1231 Encounter for screening mammogram for malignant neoplasm of breast: Secondary | ICD-10-CM

## 2023-11-13 ENCOUNTER — Ambulatory Visit (INDEPENDENT_AMBULATORY_CARE_PROVIDER_SITE_OTHER): Payer: PRIVATE HEALTH INSURANCE | Admitting: Radiology

## 2023-11-13 ENCOUNTER — Encounter: Payer: Self-pay | Admitting: Radiology

## 2023-11-13 VITALS — BP 114/68 | HR 67 | Ht 63.0 in | Wt 144.8 lb

## 2023-11-13 DIAGNOSIS — Z1331 Encounter for screening for depression: Secondary | ICD-10-CM

## 2023-11-13 DIAGNOSIS — Z01419 Encounter for gynecological examination (general) (routine) without abnormal findings: Secondary | ICD-10-CM

## 2023-11-13 DIAGNOSIS — F3289 Other specified depressive episodes: Secondary | ICD-10-CM | POA: Diagnosis not present

## 2023-11-13 DIAGNOSIS — N952 Postmenopausal atrophic vaginitis: Secondary | ICD-10-CM | POA: Diagnosis not present

## 2023-11-13 MED ORDER — ESTRADIOL 0.1 MG/GM VA CREA
1.0000 g | TOPICAL_CREAM | VAGINAL | 3 refills | Status: AC
Start: 2023-11-14 — End: ?

## 2023-11-13 NOTE — Progress Notes (Signed)
   Dominique Mendoza [birth year] [id number]   History: Postmenopausal 53 y.o. presents for annual exam.c/o vaginal dryness, using [place] for intercourse. Otherwise doing well. Labs with PCP.   Gynecologic History Postmenopausal Last Pap: 2023. Results were: normal Last mammogram: 9/24. Results were: normal Last colonoscopy: 2024   Obstetric History OB History  Gravida Para Term Preterm AB Living  2 2 1 1  0 3  SAB IAB Ectopic Multiple Live Births  0  0 1 3    # Outcome Date GA Lbr Len/2nd Weight Sex Type Anes PTL Lv  2A Preterm     M CS-Unspec  Y LIV  2B Preterm     M CS-Unspec  Y LIV  1 Term     M Vag-Spont  N LIV       11/13/2023   11:19 AM  Depression screen PHQ 2/9  Decreased Interest 0  Down, Depressed, Hopeless 0  PHQ - 2 Score 0     The following portions of the patient's history were reviewed and updated as appropriate: allergies, current medications, past family history, past medical history, past social history, past surgical history, and problem list.  Review of Systems Pertinent items noted in HPI and remainder of comprehensive ROS otherwise negative.  Past medical history, past surgical history, family history and social history were all reviewed and documented in the EPIC chart.  Exam:  Vitals:   11/13/23 1117  BP: 114/68  Pulse: 67  SpO2: 98%  Weight: 144 lb 12.8 oz (65.7 kg)  Height: 5' 3 (1.6 m)   Body mass index is 25.65 kg/m.  General appearance:  Normal Thyroid :  Symmetrical, normal in size, without palpable masses or nodularity. Respiratory  Auscultation:  Clear without wheezing or rhonchi Cardiovascular  Auscultation:  Regular rate, without rubs, murmurs or gallops  Edema/varicosities:  Not grossly evident Abdominal  Soft,nontender, without masses, guarding or rebound.  Liver/spleen:  No organomegaly noted  Hernia:  None appreciated  Skin  Inspection:  Grossly normal Breasts: Examined lying and sitting.   Right: Without masses,  retractions, nipple discharge or axillary adenopathy.   Left: Without masses, retractions, nipple discharge or axillary adenopathy. Genitourinary   Inguinal/mons:  Normal without inguinal adenopathy  External genitalia:  Normal appearing vulva with no masses, tenderness, or lesions  BUS/Urethra/Skene's glands:  Normal  Vagina:  Normal appearing with normal color and discharge, no lesions. Atrophy: moderate   Cervix:  Normal appearing without discharge or lesions  Uterus:  Normal in size, shape and contour.  Midline and mobile, nontender  Adnexa/parametria:     Rt: Normal in size, without masses or tenderness.   Lt: Normal in size, without masses or tenderness.  Anus and perineum: Normal    Dominique Mendoza, CMA present for exam  Assessment/Plan:   1. Well woman exam with routine gynecological exam (Primary) Pap 2026  2. Vaginal atrophy - estradiol (ESTRACE VAGINAL) 0.1 MG/GM vaginal cream; Place 1 g vaginally 3 (three) times a week.  Dispense: 42.5 g; Refill: 3  3. Depression  Screening negative today Meds managed by another provider    Return in 1 year for annual or sooner prn.  Unknown Schleyer B WHNP-BC, 11:32 AM 11/13/2023

## 2023-11-13 NOTE — Patient Instructions (Signed)
 Preventive Care 16-53 Years Old, Female  Preventive care refers to lifestyle choices and visits with your health care provider that can promote health and wellness. Preventive care visits are also called wellness exams.  What can I expect for my preventive care visit?  Counseling  Your health care provider may ask you questions about your:  Medical history, including:  Past medical problems.  Family medical history.  Pregnancy history.  Current health, including:  Menstrual cycle.  Method of birth control.  Emotional well-being.  Home life and relationship well-being.  Sexual activity and sexual health.  Lifestyle, including:  Alcohol, nicotine or tobacco, and drug use.  Access to firearms.  Diet, exercise, and sleep habits.  Work and work Astron[employer].  Sunscreen use.  Safety issues such as seatbelt and bike helmet use.  Physical exam  Your health care provider will check your:  Height and weight. These may be used to calculate your BMI (body mass index). BMI is a measurement that tells if you are at a healthy weight.  Waist circumference. This measures the distance around your waistline. This measurement also tells if you are at a healthy weight and may help predict your risk of certain diseases, such as type 2 diabetes and high blood pressure.  Heart rate and blood pressure.  Body temperature.  Skin for abnormal spots.  What immunizations do I need?    Vaccines are usually given at various ages, according to a schedule. Your health care provider will recommend vaccines for you based on your age, medical history, and lifestyle or other factors, such as travel or where you work.  What tests do I need?  Screening  Your health care provider may recommend screening tests for certain conditions. This may include:  Lipid and cholesterol levels.  Diabetes screening. This is done by checking your blood sugar (glucose) after you have not eaten for a while (fasting).  Pelvic exam and Pap test.  Hepatitis B test.  Hepatitis C  test.  HIV (human immunodeficiency virus) test.  STI (sexually transmitted infection) testing, if you are at risk.  Lung cancer screening.  Colorectal cancer screening.  Mammogram. Talk with your health care provider about when you should start having regular mammograms. This may depend on whether you have a family history of breast cancer.  BRCA-related cancer screening. This may be done if you have a family history of breast, ovarian, tubal, or peritoneal cancers.  Bone density scan. This is done to screen for osteoporosis.  Talk with your health care provider about your test results, treatment options, and if necessary, the need for more tests.  Follow these instructions at home:  Eating and drinking    Eat a diet that includes fresh fruits and vegetables, whole grains, lean protein, and low-fat dairy products.  Take vitamin and mineral supplements as recommended by your health care provider.  Do not drink alcohol if:  Your health care provider tells you not to drink.  You are pregnant, may be pregnant, or are planning to become pregnant.  If you drink alcohol:  Limit how much you have to 0-1 drink a day.  Know how much alcohol is in your drink. In the [place] one drink equals one 12 oz bottle of beer (355 mL), one 5 oz glass of wine (148 mL), or one 1 oz glass of hard liquor (44 mL).  Lifestyle  Brush your teeth every morning and night with fluoride toothpaste. Floss one time each day.  Exercise for at least  30 minutes 5 or more days each week.  Do not use any products that contain nicotine or tobacco. These products include cigarettes, chewing tobacco, and vaping devices, such as e-cigarettes. If you need help quitting, ask your health care provider.  Do not use drugs.  If you are sexually active, practice safe sex. Use a condom or other form of protection to prevent STIs.  If you do not wish to become pregnant, use a form of birth control. If you plan to become pregnant, see your health care provider for a  prepregnancy visit.  Take aspirin only as told by your health care provider. Make sure that you understand how much to take and what form to take. Work with your health care provider to find out whether it is safe and beneficial for you to take aspirin daily.  Find healthy ways to manage stress, such as:  Meditation, yoga, or listening to music.  Journaling.  Talking to a trusted person.  Spending time with friends and family.  Minimize exposure to UV radiation to reduce your risk of skin cancer.  Safety  Always wear your seat belt while driving or riding in a vehicle.  Do not drive:  If you have been drinking alcohol. Do not ride with someone who has been drinking.  When you are tired or distracted.  While texting.  If you have been using any mind-altering substances or drugs.  Wear a helmet and other protective equipment during sports activities.  If you have firearms in your house, make sure you follow all gun safety procedures.  Seek help if you have been physically or sexually abused.  What's next?  Visit your health care provider once a year for an annual wellness visit.  Ask your health care provider how often you should have your eyes and teeth checked.  Stay up to date on all vaccines.  This information is not intended to replace advice given to you by your health care provider. Make sure you discuss any questions you have with your health care provider.  Document Revised: 11/10/2020 Document Reviewed: 11/10/2020  [employer]vier Patient Education  2024 [employer].

## 2024-01-08 ENCOUNTER — Other Ambulatory Visit: Payer: Self-pay | Admitting: Radiology

## 2024-01-08 DIAGNOSIS — Z1231 Encounter for screening mammogram for malignant neoplasm of breast: Secondary | ICD-10-CM

## 2024-02-22 ENCOUNTER — Ambulatory Visit
Admission: RE | Admit: 2024-02-22 | Discharge: 2024-02-22 | Disposition: A | Discharge status: Home / Self Care | Payer: PRIVATE HEALTH INSURANCE | Source: Ambulatory Visit | Attending: Radiology | Admitting: Radiology

## 2024-02-22 DIAGNOSIS — Z1231 Encounter for screening mammogram for malignant neoplasm of breast: Secondary | ICD-10-CM
